# Patient Record
Sex: Female | Born: 1965 | Race: White | Hispanic: No | Marital: Married | Smoking: Former smoker
Health system: Southern US, Community
[De-identification: ages and names within clinical notes are randomized; demographics above are authoritative.]

## PROBLEM LIST (undated history)

## (undated) DIAGNOSIS — B019 Varicella without complication: Secondary | ICD-10-CM

## (undated) DIAGNOSIS — R51 Headache: Secondary | ICD-10-CM

## (undated) DIAGNOSIS — G43909 Migraine, unspecified, not intractable, without status migrainosus: Secondary | ICD-10-CM

## (undated) DIAGNOSIS — T7840XA Allergy, unspecified, initial encounter: Secondary | ICD-10-CM

## (undated) DIAGNOSIS — K259 Gastric ulcer, unspecified as acute or chronic, without hemorrhage or perforation: Secondary | ICD-10-CM

## (undated) DIAGNOSIS — M1990 Unspecified osteoarthritis, unspecified site: Secondary | ICD-10-CM

## (undated) DIAGNOSIS — F32A Depression, unspecified: Secondary | ICD-10-CM

## (undated) DIAGNOSIS — F329 Major depressive disorder, single episode, unspecified: Secondary | ICD-10-CM

## (undated) DIAGNOSIS — K219 Gastro-esophageal reflux disease without esophagitis: Secondary | ICD-10-CM

## (undated) DIAGNOSIS — B977 Papillomavirus as the cause of diseases classified elsewhere: Secondary | ICD-10-CM

## (undated) DIAGNOSIS — R519 Headache, unspecified: Secondary | ICD-10-CM

## (undated) DIAGNOSIS — E785 Hyperlipidemia, unspecified: Secondary | ICD-10-CM

## (undated) DIAGNOSIS — U071 COVID-19: Secondary | ICD-10-CM

## (undated) DIAGNOSIS — N200 Calculus of kidney: Secondary | ICD-10-CM

## (undated) DIAGNOSIS — F419 Anxiety disorder, unspecified: Secondary | ICD-10-CM

## (undated) DIAGNOSIS — R87619 Unspecified abnormal cytological findings in specimens from cervix uteri: Secondary | ICD-10-CM

## (undated) HISTORY — DX: Unspecified osteoarthritis, unspecified site: M19.90

## (undated) HISTORY — DX: Allergy, unspecified, initial encounter: T78.40XA

## (undated) HISTORY — DX: Anxiety disorder, unspecified: F41.9

## (undated) HISTORY — PX: OTHER SURGICAL HISTORY: SHX169

## (undated) HISTORY — DX: Unspecified abnormal cytological findings in specimens from cervix uteri: R87.619

## (undated) HISTORY — DX: Hyperlipidemia, unspecified: E78.5

## (undated) HISTORY — DX: Headache, unspecified: R51.9

## (undated) HISTORY — DX: Varicella without complication: B01.9

## (undated) HISTORY — DX: Gastric ulcer, unspecified as acute or chronic, without hemorrhage or perforation: K25.9

## (undated) HISTORY — DX: Gastro-esophageal reflux disease without esophagitis: K21.9

## (undated) HISTORY — DX: Papillomavirus as the cause of diseases classified elsewhere: B97.7

## (undated) HISTORY — DX: Headache: R51

## (undated) HISTORY — DX: Calculus of kidney: N20.0

## (undated) HISTORY — PX: HERNIA REPAIR: SHX51

## (undated) HISTORY — DX: Major depressive disorder, single episode, unspecified: F32.9

## (undated) HISTORY — PX: TONSILLECTOMY: SHX5217

## (undated) HISTORY — DX: Depression, unspecified: F32.A

## (undated) HISTORY — PX: UPPER GI ENDOSCOPY: SHX6162

## (undated) HISTORY — DX: COVID-19: U07.1

## (undated) HISTORY — DX: Migraine, unspecified, not intractable, without status migrainosus: G43.909

---

## 2001-06-16 ENCOUNTER — Other Ambulatory Visit: Admission: RE | Admit: 2001-06-16 | Discharge: 2001-06-16 | Payer: Self-pay | Admitting: Obstetrics and Gynecology

## 2001-09-24 ENCOUNTER — Ambulatory Visit (HOSPITAL_COMMUNITY): Admission: RE | Admit: 2001-09-24 | Discharge: 2001-09-24 | Payer: Self-pay | Admitting: Obstetrics and Gynecology

## 2001-11-17 ENCOUNTER — Inpatient Hospital Stay (HOSPITAL_COMMUNITY): Admission: AD | Admit: 2001-11-17 | Discharge: 2001-11-17 | Payer: Self-pay | Admitting: Obstetrics and Gynecology

## 2001-11-30 ENCOUNTER — Inpatient Hospital Stay (HOSPITAL_COMMUNITY): Admission: AD | Admit: 2001-11-30 | Discharge: 2001-11-30 | Payer: Self-pay | Admitting: Obstetrics and Gynecology

## 2001-12-21 ENCOUNTER — Inpatient Hospital Stay (HOSPITAL_COMMUNITY): Admission: AD | Admit: 2001-12-21 | Discharge: 2001-12-23 | Payer: Self-pay | Admitting: Obstetrics and Gynecology

## 2002-01-29 ENCOUNTER — Other Ambulatory Visit: Admission: RE | Admit: 2002-01-29 | Discharge: 2002-01-29 | Payer: Self-pay | Admitting: Obstetrics and Gynecology

## 2004-08-08 ENCOUNTER — Ambulatory Visit: Payer: Self-pay | Admitting: Unknown Physician Specialty

## 2004-09-20 ENCOUNTER — Observation Stay: Payer: Self-pay | Admitting: Obstetrics & Gynecology

## 2004-10-13 ENCOUNTER — Inpatient Hospital Stay: Payer: Self-pay

## 2007-04-07 ENCOUNTER — Ambulatory Visit: Payer: Self-pay | Admitting: Unknown Physician Specialty

## 2008-03-22 ENCOUNTER — Ambulatory Visit: Payer: Self-pay | Admitting: Unknown Physician Specialty

## 2008-04-12 ENCOUNTER — Ambulatory Visit: Payer: Self-pay | Admitting: Unknown Physician Specialty

## 2010-03-28 ENCOUNTER — Ambulatory Visit: Payer: Self-pay | Admitting: Unknown Physician Specialty

## 2011-04-30 ENCOUNTER — Ambulatory Visit: Payer: Self-pay | Admitting: Unknown Physician Specialty

## 2011-12-09 ENCOUNTER — Ambulatory Visit: Payer: Self-pay | Admitting: Internal Medicine

## 2011-12-10 ENCOUNTER — Ambulatory Visit: Payer: Self-pay | Admitting: Internal Medicine

## 2015-01-12 ENCOUNTER — Encounter: Payer: Self-pay | Admitting: General Surgery

## 2015-01-12 LAB — HM MAMMOGRAPHY

## 2015-01-12 LAB — HM PAP SMEAR

## 2015-01-26 ENCOUNTER — Ambulatory Visit (INDEPENDENT_AMBULATORY_CARE_PROVIDER_SITE_OTHER): Payer: BC Managed Care – PPO | Admitting: General Surgery

## 2015-01-26 ENCOUNTER — Encounter: Payer: Self-pay | Admitting: General Surgery

## 2015-01-26 VITALS — BP 102/60 | HR 72 | Resp 14 | Ht 64.0 in | Wt 141.0 lb

## 2015-01-26 DIAGNOSIS — K5732 Diverticulitis of large intestine without perforation or abscess without bleeding: Secondary | ICD-10-CM

## 2015-01-26 DIAGNOSIS — R1032 Left lower quadrant pain: Secondary | ICD-10-CM

## 2015-01-26 DIAGNOSIS — Z1211 Encounter for screening for malignant neoplasm of colon: Secondary | ICD-10-CM | POA: Diagnosis not present

## 2015-01-26 MED ORDER — POLYETHYLENE GLYCOL 3350 17 GM/SCOOP PO POWD: 1.0000 | Freq: Once | ORAL | Status: DC

## 2015-01-26 MED ORDER — DOXYCYCLINE HYCLATE 100 MG PO CAPS: 100.0000 mg | ORAL_CAPSULE | Freq: Two times a day (BID) | ORAL | Status: DC

## 2015-01-26 NOTE — Progress Notes (Signed)
Patient ID: Brooke Chambers, female DOB: 05/13/1966, 48 y.o. MRN: [id number]  Chief Complaint   Patient presents with   .  Colonoscopy    HPI  Brooke Chambers is a 49 y.o. female. Here today to discuss having a colonoscopy. She has not had one in the past. She does admit to recent gastrointestinal changes over the past 4 weeks. Bowels move 3 times daily and are loose. Denies waking up at night for bowel movements. Denies any bleeding. She has been trying to eat healthier for about 3 months, avoiding sweets and processed foods. No weight loss.  Denies family history of colon cancer. Denies any appetite changes. She does complain of feeling bloated, all [place] which has been for several years.  Denies reflux issues.  She did have a transvaginal ultrasound that was normal. She does state that 4-5 weeks ago she started having sharp pains left flank./abdomen and thought it was kidney stones.  Her last kidney stone episode was mid 1990's.  HPI  Past Medical History   Diagnosis  Date   .  Kidney stone     Past Surgical History   Procedure  Laterality  Date   .  Tonsillectomy     .  Hernia repair  Left     No family history on file.  Social History  History   Substance Use Topics   .  Smoking status:  Former Smoker -- 7 years     Quit date:  08/26/1992   .  Smokeless tobacco:  Never Used   .  Alcohol Use:  0.0 oz/week     0 Standard drinks or equivalent per week      Comment: occasionally    Allergies   Allergen  Reactions   .  Aleve [Naproxen]  Swelling    Current Outpatient Prescriptions   Medication  Sig  Dispense  Refill   .  doxycycline (VIBRAMYCIN) 100 MG capsule  Take 1 capsule (100 mg total) by mouth 2 (two) times daily.  20 capsule  0   .  polyethylene glycol powder (GLYCOLAX/MIRALAX) powder  Take 255 g by mouth once.  255 g  0    No current facility-administered medications for this visit.    Review of Systems  Review of Systems  Constitutional: Negative.  Respiratory: Negative.    Cardiovascular: Negative.  Gastrointestinal: Positive for diarrhea and abdominal distention. Negative for nausea, vomiting and blood in stool.   Blood pressure 102/60, pulse 72, resp. rate 14, height 5' 4" (1.626 m), weight 141 lb (63.957 kg).  Physical Exam  Physical Exam  Constitutional: She is oriented to person, place, and time. She appears well-developed and well-nourished.  Neck: Neck supple.  Cardiovascular: Normal rate, regular rhythm and normal heart sounds.  Pulmonary/Chest: Effort normal and breath sounds normal.  Abdominal: Soft. Normal appearance and bowel sounds are normal. There is tenderness in the left lower quadrant. Hernia confirmed negative in the right inguinal area and confirmed negative in the left inguinal area.    Moderate tenderness in the left lower quadrant with firm pressure.  Lymphadenopathy:  She has no cervical adenopathy.  Neurological: She is alert and oriented to person, place, and time.  Skin: Skin is warm and dry.   Data Reviewed  The patient reports transvaginal ultrasound completed at [facility] was normal.  GYN notes dated 01/12/2015 from Philip Rosenow, M.D. reviewed.  Assessment   Left lower quadrant pain, possible smoldering diverticulitis.  Change in bowel habits, likely related   to above.   Plan   We'll place her on a ten-Penman course of doxycycline. We'll check a baseline CBC and sedimentation rate. I anticipate both will be normal. The plan for a colonoscopy later this month and at that time assess her improvement on the short course of doxycycline. The importance of avoiding excessive sun exposure was emphasized.   Labs today.  Doxycycline 100 mg for 10 days.  Colonoscopy with possible biopsy/polypectomy prn: Information regarding the procedure, including its potential risks and complications (including but not limited to perforation of the bowel, which may require emergency surgery to repair, and bleeding) was verbally given to the  patient. Educational information regarding lower instestinal endoscopy was given to the patient. Written instructions for how to complete the bowel prep using Miralax were provided. The importance of drinking ample fluids to avoid dehydration as a result of the prep emphasized.  Patient has been scheduled for a colonoscopy at [facility] on 02/14/15. She is aware to pre register with the hospital at least two days prior. Miralax prescription has been sent into her pharmacy. Patient is aware of date and instructions.  PCP: None  Ref: Dr. Rosenow  Dorma Altman W  01/27/2015, 12:30 PM  

## 2015-01-26 NOTE — Patient Instructions (Addendum)
Colonoscopy A colonoscopy is an exam to look at the entire large intestine (colon). This exam can help find problems such as tumors, polyps, inflammation, and areas of bleeding. The exam takes about 1 hour.  LET [facility] CARE PROVIDER KNOW ABOUT:   Any allergies you have.  All medicines you are taking, including vitamins, herbs, eye drops, creams, and over-the-counter medicines.  Previous problems you or members of your family have had with the use of anesthetics.  Any blood disorders you have.  Previous surgeries you have had.  Medical conditions you have. RISKS AND COMPLICATIONS  Generally, this is a safe procedure. However, as with any procedure, complications can occur. Possible complications include:  Bleeding.  Tearing or rupture of the colon wall.  Reaction to medicines given during the exam.  Infection (rare). BEFORE THE PROCEDURE   Ask your health care provider about changing or stopping your regular medicines.  You may be prescribed an oral bowel prep. This involves drinking a large amount of medicated liquid, starting the Micheli before your procedure. The liquid will cause you to have multiple loose stools until your stool is almost clear or light green. This cleans out your colon in preparation for the procedure.  Do not eat or drink anything else once you have started the bowel prep, unless your health care provider tells you it is safe to do so.  Arrange for someone to drive you home after the procedure. PROCEDURE   You will be given medicine to help you relax (sedative).  You will lie on your side with your knees bent.  A long, flexible tube with a light and camera on the end (colonoscope) will be inserted through the rectum and into the colon. The camera sends video back to a computer screen as it moves through the colon. The colonoscope also releases carbon dioxide gas to inflate the colon. This helps your health care provider see the area better.  During  the exam, your health care provider may take a small tissue sample (biopsy) to be examined under a microscope if any abnormalities are found.  The exam is finished when the entire colon has been viewed. AFTER THE PROCEDURE   Do not drive for 24 hours after the exam.  You may have a small amount of blood in your stool.  You may pass moderate amounts of gas and have mild abdominal cramping or bloating. This is caused by the gas used to inflate your colon during the exam.  Ask when your test results will be ready and how you will get your results. Make sure you get your test results. Document Released: 08/09/2000 Document Revised: 06/02/2013 Document Reviewed: 04/19/2013 [facility] Patient Information 2015 [place], [place]. This information is not intended to replace advice given to you by your health care provider. Make sure you discuss any questions you have with your health care provider. Diverticulitis Diverticulitis is inflammation or infection of small pouches in your colon that form when you have a condition called diverticulosis. The pouches in your colon are called diverticula. Your colon, or large intestine, is where water is absorbed and stool is formed. Complications of diverticulitis can include:  Bleeding.  Severe infection.  Severe pain.  Perforation of your colon.  Obstruction of your colon. CAUSES  Diverticulitis is caused by bacteria. Diverticulitis happens when stool becomes trapped in diverticula. This allows bacteria to grow in the diverticula, which can lead to inflammation and infection. RISK FACTORS People with diverticulosis are at risk for diverticulitis. Eating  a diet that does not include enough fiber from fruits and vegetables may make diverticulitis more likely to develop. SYMPTOMS  Symptoms of diverticulitis may include:  Abdominal pain and tenderness. The pain is normally located on the left side of the abdomen, but may occur in other areas.  Fever and  chills.  Bloating.  Cramping.  Nausea.  Vomiting.  Constipation.  Diarrhea.  Blood in your stool. DIAGNOSIS  Your health care provider will ask you about your medical history and do a physical exam. You may need to have tests done because many medical conditions can cause the same symptoms as diverticulitis. Tests may include:  Blood tests.  Urine tests.  Imaging tests of the abdomen, including X-rays and CT scans. When your condition is under control, your health care provider may recommend that you have a colonoscopy. A colonoscopy can show how severe your diverticula are and whether something else is causing your symptoms. TREATMENT  Most cases of diverticulitis are mild and can be treated at home. Treatment may include:  Taking over-the-counter pain medicines.  Following a clear liquid diet.  Taking antibiotic medicines by mouth for 7-10 days. More severe cases may be treated at a hospital. Treatment may include:  Not eating or drinking.  Taking prescription pain medicine.  Receiving antibiotic medicines through an IV tube.  Receiving fluids and nutrition through an IV tube.  Surgery. HOME CARE INSTRUCTIONS   Follow your health care provider's instructions carefully.  Follow a full liquid diet or other diet as directed by your health care provider. After your symptoms improve, your health care provider may tell you to change your diet. He or she may recommend you eat a high-fiber diet. Fruits and vegetables are good sources of fiber. Fiber makes it easier to pass stool.  Take fiber supplements or probiotics as directed by your health care provider.  Only take medicines as directed by your health care provider.  Keep all your follow-up appointments. SEEK MEDICAL CARE IF:   Your pain does not improve.  You have a hard time eating food.  Your bowel movements do not return to normal. SEEK IMMEDIATE MEDICAL CARE IF:   Your pain becomes worse.  Your  symptoms do not get better.  Your symptoms suddenly get worse.  You have a fever.  You have repeated vomiting.  You have bloody or black, tarry stools. MAKE SURE YOU:   Understand these instructions.  Will watch your condition.  Will get help right away if you are not doing well or get worse. Document Released: 05/22/2005 Document Revised: 08/17/2013 Document Reviewed: 07/07/2013 [facility] Patient Information 2015 [place]. This information is not intended to replace advice given to you by your health care provider. Make sure you discuss any questions you have with your health care provider.  [facility]. She is aware to pre register with the hospital at least two days prior. Miralax prescription has been sent into her pharmacy. Patient is aware of date and instructions.

## 2015-01-27 DIAGNOSIS — R1032 Left lower quadrant pain: Secondary | ICD-10-CM | POA: Insufficient documentation

## 2015-01-27 DIAGNOSIS — Z1211 Encounter for screening for malignant neoplasm of colon: Secondary | ICD-10-CM | POA: Insufficient documentation

## 2015-01-27 DIAGNOSIS — K5732 Diverticulitis of large intestine without perforation or abscess without bleeding: Secondary | ICD-10-CM | POA: Insufficient documentation

## 2015-01-27 LAB — CBC WITH DIFFERENTIAL/PLATELET
BASOS ABS: 0 10*3/uL (ref 0.0–0.2)
BASOS: 1 %
EOS (ABSOLUTE): 0.2 10*3/uL (ref 0.0–0.4)
Eos: 3 %
HEMOGLOBIN: 12 g/dL (ref 11.1–15.9)
Hematocrit: 35.6 % (ref 34.0–46.6)
IMMATURE GRANS (ABS): 0 10*3/uL (ref 0.0–0.1)
Immature Granulocytes: 0 %
LYMPHS: 27 %
Lymphocytes Absolute: 1.7 10*3/uL (ref 0.7–3.1)
MCH: 28.4 pg (ref 26.6–33.0)
MCHC: 33.7 g/dL (ref 31.5–35.7)
MCV: 84 fL (ref 79–97)
Monocytes Absolute: 0.5 10*3/uL (ref 0.1–0.9)
Monocytes: 8 %
Neutrophils Absolute: 3.9 10*3/uL (ref 1.4–7.0)
Neutrophils: 61 %
Platelets: 239 10*3/uL (ref 150–379)
RBC: 4.22 x10E6/uL (ref 3.77–5.28)
RDW: 12.8 % (ref 12.3–15.4)
WBC: 6.2 10*3/uL (ref 3.4–10.8)

## 2015-01-27 LAB — SEDIMENTATION RATE: Sed Rate: 2 mm/hr (ref 0–40)

## 2015-01-27 NOTE — H&P (Signed)
Patient ID: Brooke FesterLisa L Wadle, female DOB: 08/08/1966, 49 y.o. MRN: [id number]  Chief Complaint   Patient presents with   .  Colonoscopy    HPI  Brooke Chambers is a 49 y.o. female. Here today to discuss having a colonoscopy. She has not had one in the past. She does admit to recent gastrointestinal changes over the past 4 weeks. Bowels move 3 times daily and are loose. Denies waking up at night for bowel movements. Denies any bleeding. She has been trying to eat healthier for about 3 months, avoiding sweets and processed foods. No weight loss.  Denies family history of colon cancer. Denies any appetite changes. She does complain of feeling bloated, all [place] which has been for several years.  Denies reflux issues.  She did have a transvaginal ultrasound that was normal. She does state that 4-5 weeks ago she started having sharp pains left flank./abdomen and thought it was kidney stones.  Her last kidney stone episode was mid 1990's.  HPI  Past Medical History   Diagnosis  Date   .  Kidney stone     Past Surgical History   Procedure  Laterality  Date   .  Tonsillectomy     .  Hernia repair  Left     No family history on file.  Social History  History   Substance Use Topics   .  Smoking status:  Former Smoker -- 7 years     Quit date:  08/26/1992   .  Smokeless tobacco:  Never Used   .  Alcohol Use:  0.0 oz/week     0 Standard drinks or equivalent per week      Comment: occasionally    Allergies   Allergen  Reactions   .  Aleve [Naproxen]  Swelling    Current Outpatient Prescriptions   Medication  Sig  Dispense  Refill   .  doxycycline (VIBRAMYCIN) 100 MG capsule  Take 1 capsule (100 mg total) by mouth 2 (two) times daily.  20 capsule  0   .  polyethylene glycol powder (GLYCOLAX/MIRALAX) powder  Take 255 g by mouth once.  255 g  0    No current facility-administered medications for this visit.    Review of Systems  Review of Systems  Constitutional: Negative.  Respiratory: Negative.    Cardiovascular: Negative.  Gastrointestinal: Positive for diarrhea and abdominal distention. Negative for nausea, vomiting and blood in stool.   Blood pressure 102/60, pulse 72, resp. rate 14, height 5\' 4"  (1.626 m), weight 141 lb (63.957 kg).  Physical Exam  Physical Exam  Constitutional: She is oriented to person, place, and time. She appears well-developed and well-nourished.  Neck: Neck supple.  Cardiovascular: Normal rate, regular rhythm and normal heart sounds.  Pulmonary/Chest: Effort normal and breath sounds normal.  Abdominal: Soft. Normal appearance and bowel sounds are normal. There is tenderness in the left lower quadrant. Hernia confirmed negative in the right inguinal area and confirmed negative in the left inguinal area.    Moderate tenderness in the left lower quadrant with firm pressure.  Lymphadenopathy:  She has no cervical adenopathy.  Neurological: She is alert and oriented to person, place, and time.  Skin: Skin is warm and dry.   Data Reviewed  The patient [facility].  GYN notes dated 01/12/2015 from Kate SablePhilip Rosenow, M.D. reviewed.  Assessment   Left lower quadrant pain, possible smoldering diverticulitis.  Change in bowel habits, likely related  to above.   Plan   We'll place her on a ten-Kowalczyk course of doxycycline. We'll check a baseline CBC and sedimentation rate. I anticipate both will be normal. The plan for a colonoscopy later this month and at that time assess her improvement on the short course of doxycycline. The importance of avoiding excessive sun exposure was emphasized.   Labs today.  Doxycycline 100 mg for 10 days.  Colonoscopy with possible biopsy/polypectomy prn: Information regarding the procedure, including its potential risks and complications (including but not limited to perforation of the bowel, which may require emergency surgery to repair, and bleeding) was verbally given to the  patient. Educational information regarding lower instestinal endoscopy was given to the patient. Written instructions for how to complete the bowel prep using Miralax were provided. The importance of drinking ample fluids to avoid dehydration as a result of the prep emphasized.  Patient has been scheduled for a colonoscopy at [facility] on 02/14/15. She is aware to pre register with the hospital at least two days prior. Miralax prescription has been sent into her pharmacy. Patient is aware of date and instructions.  PCP: None  Ref: Dr. Janann Colonel, Merrily Pew  01/27/2015, 12:30 PM

## 2015-02-14 ENCOUNTER — Ambulatory Visit: Payer: BC Managed Care – PPO | Admitting: Anesthesiology

## 2015-02-14 ENCOUNTER — Ambulatory Visit
Admission: RE | Admit: 2015-02-14 | Discharge: 2015-02-14 | Disposition: A | Discharge status: Home / Self Care | Payer: BC Managed Care – PPO | Source: Ambulatory Visit | Attending: General Surgery | Admitting: General Surgery

## 2015-02-14 ENCOUNTER — Encounter
Admission: RE | Disposition: A | Discharge status: Home / Self Care | Payer: Self-pay | Source: Ambulatory Visit | Attending: General Surgery

## 2015-02-14 DIAGNOSIS — Z87442 Personal history of urinary calculi: Secondary | ICD-10-CM | POA: Diagnosis not present

## 2015-02-14 DIAGNOSIS — Z9889 Other specified postprocedural states: Secondary | ICD-10-CM | POA: Diagnosis not present

## 2015-02-14 DIAGNOSIS — Z87891 Personal history of nicotine dependence: Secondary | ICD-10-CM | POA: Diagnosis not present

## 2015-02-14 DIAGNOSIS — Z1211 Encounter for screening for malignant neoplasm of colon: Secondary | ICD-10-CM | POA: Diagnosis not present

## 2015-02-14 DIAGNOSIS — Z886 Allergy status to analgesic agent status: Secondary | ICD-10-CM | POA: Diagnosis not present

## 2015-02-14 HISTORY — PX: COLONOSCOPY WITH PROPOFOL: SHX5780

## 2015-02-14 SURGERY — COLONOSCOPY WITH PROPOFOL
Anesthesia: General

## 2015-02-14 MED ORDER — MIDAZOLAM HCL 5 MG/5ML IJ SOLN
INTRAMUSCULAR | Status: DC | PRN
  Administered 2015-02-14: 2 mg via INTRAVENOUS

## 2015-02-14 MED ORDER — SODIUM CHLORIDE 0.9 % IV SOLN
INTRAVENOUS | Status: DC
Start: 2015-02-14 — End: 2015-02-14

## 2015-02-14 MED ORDER — PROPOFOL 10 MG/ML IV BOLUS
INTRAVENOUS | Status: DC | PRN
  Administered 2015-02-14: 50 mg via INTRAVENOUS
  Administered 2015-02-14: 40 mg via INTRAVENOUS

## 2015-02-14 MED ORDER — LIDOCAINE HCL (CARDIAC) 20 MG/ML IV SOLN
INTRAVENOUS | Status: DC | PRN
  Administered 2015-02-14: 30 mg via INTRAVENOUS

## 2015-02-14 MED ORDER — PROPOFOL INFUSION 10 MG/ML OPTIME
INTRAVENOUS | Status: DC | PRN
  Administered 2015-02-14: 120 ug/kg/min via INTRAVENOUS

## 2015-02-14 MED ORDER — SODIUM CHLORIDE 0.45 % IV SOLN
INTRAVENOUS | Status: DC
Start: 2015-02-14 — End: 2015-02-14
  Administered 2015-02-14: 1000 mL via INTRAVENOUS

## 2015-02-14 MED ORDER — FENTANYL CITRATE (PF) 100 MCG/2ML IJ SOLN
INTRAMUSCULAR | Status: DC | PRN
  Administered 2015-02-14: 50 ug via INTRAVENOUS

## 2015-02-14 NOTE — Transfer of Care (Signed)
Immediate Anesthesia Transfer of Care Note  Patient: Brooke Chambers  Procedure(s) Performed: Procedure(s): COLONOSCOPY WITH PROPOFOL (N/A)  Patient Location: PACU  Anesthesia Type:General  Level of Consciousness: awake and patient cooperative  Airway & Oxygen Therapy: Patient Spontanous Breathing and Patient connected to nasal cannula oxygen  Post-op Assessment: Report given to RN and Post -op Vital signs reviewed and stable  Post vital signs: Reviewed and stable  Last Vitals:  Filed Vitals:   02/14/15 1417  BP: 115/65  Pulse: 65  Temp: 36.8 C  Resp: 16    Complications: No apparent anesthesia complications

## 2015-02-14 NOTE — Anesthesia Postprocedure Evaluation (Signed)
  Anesthesia Post-op Note  Patient: Brooke Chambers  Procedure(s) Performed: Procedure(s): COLONOSCOPY WITH PROPOFOL (N/A)  Anesthesia type:General  Patient location: PACU  Post pain: Pain level controlled  Post assessment: Post-op Vital signs reviewed, Patient's Cardiovascular Status Stable, Respiratory Function Stable, Patent Airway and No signs of Nausea or vomiting  Post vital signs: Reviewed and stable  Last Vitals:  Filed Vitals:   02/14/15 1620  BP: 101/65  Pulse: 63  Temp:   Resp: 11    Level of consciousness: awake, alert  and patient cooperative  Complications: No apparent anesthesia complications

## 2015-02-14 NOTE — Anesthesia Preprocedure Evaluation (Signed)
Anesthesia Evaluation  Patient identified by MRN, date of birth, ID band Patient awake    Reviewed: Allergy & Precautions, NPO status , Patient's Chart, lab work & pertinent test results  History of Anesthesia Complications Negative for: history of anesthetic complications  Airway Mallampati: II  TM Distance: >3 FB Neck ROM: Full    Dental no notable dental hx.    Pulmonary neg pulmonary ROS, former smoker,  breath sounds clear to auscultation  Pulmonary exam normal       Cardiovascular Exercise Tolerance: Good negative cardio ROS Normal cardiovascular examRhythm:Regular Rate:Normal     Neuro/Psych negative neurological ROS  negative psych ROS   GI/Hepatic negative GI ROS, Neg liver ROS,   Endo/Other  negative endocrine ROS  Renal/GU negative Renal ROS  negative genitourinary   Musculoskeletal negative musculoskeletal ROS (+)   Abdominal   Peds negative pediatric ROS (+)  Hematology negative hematology ROS (+)   Anesthesia Other Findings   Reproductive/Obstetrics negative OB ROS                             Anesthesia Physical Anesthesia Plan  ASA: II  Anesthesia Plan: General   Post-op Pain Management:    Induction: Intravenous  Airway Management Planned: Nasal Cannula  Additional Equipment:   Intra-op Plan:   Post-operative Plan:   Informed Consent: I have reviewed the patients History and Physical, chart, labs and discussed the procedure including the risks, benefits and alternatives for the proposed anesthesia with the patient or authorized representative who has indicated his/her understanding and acceptance.     Plan Discussed with: CRNA and Surgeon  Anesthesia Plan Comments:         Anesthesia Quick Evaluation

## 2015-02-14 NOTE — Discharge Instructions (Signed)
No driving today. °Resume your regular diet. ° °

## 2015-02-14 NOTE — H&P (Signed)
LLQ pain resolved w/ doxycycline rx. Well tolerated. Did well with prep. Lungs: Clear. Cardio: RR. ABD: Soft. No residual LLQ tenderness. HEENT: Neg. For colonoscopy.

## 2015-02-14 NOTE — Op Note (Signed)
[facility] Patient Name: Brooke Chambers Procedure Date: 02/14/2015 3:16 PM MRN: [id number] Account #: [id number] Date of Birth: [birth year] Admit Type: Outpatient Age: 49 Room: [facility] ENDO ROOM 1 Gender: Female Note Status: Finalized Procedure:         Colonoscopy Indications:       Screening for colorectal malignant neoplasm Providers:         Earline Mayotte, MD Referring MD:      No Local Md, MD (Referring MD) Medicines:         Monitored Anesthesia Care Complications:     No immediate complications. Procedure:         Pre-Anesthesia Assessment:                    - Prior to the procedure, a History and Physical was                     performed, and patient medications, allergies and                     sensitivities were reviewed. The patient's tolerance of                     previous anesthesia was reviewed.                    - The risks and benefits of the procedure and the sedation                     options and risks were discussed with the patient. All                     questions were answered and informed consent was obtained.                    After obtaining informed consent, the colonoscope was                     passed under direct vision. Throughout the procedure, the                     patient's blood pressure, pulse, and oxygen saturations                     were monitored continuously. The Olympus CF-Q160AL                     colonoscope (S#. [id number]) was introduced through the anus                     and advanced to the the cecum, identified by appendiceal                     orifice and ileocecal valve. The colonoscopy was performed                     without difficulty. The patient tolerated the procedure                     well. The quality of the bowel preparation was excellent. Findings:      The entire examined colon appeared normal on direct and retroflexion       views. Impression:        - The entire examined  colon is normal on direct and  retroflexion views.                    - No specimens collected. Recommendation:    - Repeat colonoscopy in 10 years for screening purposes. Diagnosis Code(s): --- Professional ---                    Z12.11, Encounter for screening for malignant neoplasm of                     colon Earline Mayotte, MD 02/14/2015 3:41:50 PM This report has been signed electronically. Number of Addenda: 0 Note Initiated On: 02/14/2015 3:16 PM Scope Withdrawal Time: 0 hours 6 minutes 44 seconds  Total Procedure Duration: 0 hours 14 minutes 18 seconds       [facility]

## 2015-02-15 ENCOUNTER — Encounter: Payer: Self-pay | Admitting: General Surgery

## 2015-02-15 LAB — POCT PREGNANCY, URINE: PREG TEST UR: NEGATIVE

## 2015-03-01 NOTE — Addendum Note (Signed)
Addendum  created 03/01/15 1605 by Forestine Chutearrie Rachal Dvorsky, MD   Modules edited: Anesthesia Responsible Staff

## 2017-11-27 ENCOUNTER — Encounter: Payer: Self-pay | Admitting: Internal Medicine

## 2017-11-27 ENCOUNTER — Ambulatory Visit: Payer: BC Managed Care – PPO | Admitting: Internal Medicine

## 2017-11-27 VITALS — BP 112/60 | HR 70 | Temp 98.9°F | Ht 64.0 in | Wt 142.6 lb

## 2017-11-27 DIAGNOSIS — M542 Cervicalgia: Secondary | ICD-10-CM | POA: Diagnosis not present

## 2017-11-27 DIAGNOSIS — R079 Chest pain, unspecified: Secondary | ICD-10-CM

## 2017-11-27 DIAGNOSIS — R0602 Shortness of breath: Secondary | ICD-10-CM | POA: Diagnosis not present

## 2017-11-27 DIAGNOSIS — Z1231 Encounter for screening mammogram for malignant neoplasm of breast: Secondary | ICD-10-CM

## 2017-11-27 DIAGNOSIS — L570 Actinic keratosis: Secondary | ICD-10-CM | POA: Diagnosis not present

## 2017-11-27 DIAGNOSIS — F419 Anxiety disorder, unspecified: Secondary | ICD-10-CM

## 2017-11-27 DIAGNOSIS — J302 Other seasonal allergic rhinitis: Secondary | ICD-10-CM | POA: Diagnosis not present

## 2017-11-27 DIAGNOSIS — K219 Gastro-esophageal reflux disease without esophagitis: Secondary | ICD-10-CM

## 2017-11-27 DIAGNOSIS — F329 Major depressive disorder, single episode, unspecified: Secondary | ICD-10-CM | POA: Diagnosis not present

## 2017-11-27 NOTE — Progress Notes (Signed)
Pre visit review using our clinic review tool, if applicable. No additional management support is needed unless otherwise documented below in the visit note. 

## 2017-11-27 NOTE — Patient Instructions (Addendum)
Cerave/cetaphil lotion or cream. Cream more moisturizing  Pap at f/u  Labs fasting [facility] fasting x 12 hours  Pap mid to late May  Please schedule mammogram   Generalized Anxiety Disorder, Adult Generalized anxiety disorder (GAD) is a mental health disorder. People with this condition constantly worry about everyday events. Unlike normal anxiety, worry related to GAD is not triggered by a specific event. These worries also do not fade or get better with time. GAD interferes with life functions, including relationships, work, and school. GAD can vary from mild to severe. People with severe GAD can have intense waves of anxiety with physical symptoms (panic attacks). What are the causes? The exact cause of GAD is not known. What increases the risk? This condition is more likely to develop in:  Women.  People who have a family history of anxiety disorders.  People who are very shy.  People who experience very stressful life events, such as the death of a loved one.  People who have a very stressful family environment.  What are the signs or symptoms? People with GAD often worry excessively about many things in their lives, such as their health and family. They may also be overly concerned about:  Doing well at work.  Being on time.  Natural disasters.  Friendships.  Physical symptoms of GAD include:  Fatigue.  Muscle tension or having muscle twitches.  Trembling or feeling shaky.  Being easily startled.  Feeling like your heart is pounding or racing.  Feeling out of breath or like you cannot take a deep breath.  Having trouble falling asleep or staying asleep.  Sweating.  Nausea, diarrhea, or irritable bowel syndrome (IBS).  Headaches.  Trouble concentrating or remembering facts.  Restlessness.  Irritability.  How is this diagnosed? Your health care provider can diagnose GAD based on your symptoms and medical history. You will also have a physical exam.  The health care provider will ask specific questions about your symptoms, including how severe they are, when they started, and if they come and go. Your health care provider may ask you about your use of alcohol or drugs, including prescription medicines. Your health care provider may refer you to a mental health specialist for further evaluation. Your health care provider will do a thorough examination and may perform additional tests to rule out other possible causes of your symptoms. To be diagnosed with GAD, a person must have anxiety that:  Is out of his or her control.  Affects several different aspects of his or her life, such as work and relationships.  Causes distress that makes him or her unable to take part in normal activities.  Includes at least three physical symptoms of GAD, such as restlessness, fatigue, trouble concentrating, irritability, muscle tension, or sleep problems.  Before your health care provider can confirm a diagnosis of GAD, these symptoms must be present more days than they are not, and they must last for six months or longer. How is this treated? The following therapies are usually used to treat GAD:  Medicine. Antidepressant medicine is usually prescribed for long-term daily control. Antianxiety medicines may be added in severe cases, especially when panic attacks occur.  Talk therapy (psychotherapy). Certain types of talk therapy can be helpful in treating GAD by providing support, education, and guidance. Options include: ? Cognitive behavioral therapy (CBT). People learn coping skills and techniques to ease their anxiety. They learn to identify unrealistic or negative thoughts and behaviors and to replace them with positive ones. ?  Acceptance and commitment therapy (ACT). This treatment teaches people how to be mindful as a way to cope with unwanted thoughts and feelings. ? Biofeedback. This process trains you to manage your body's response (physiological  response) through breathing techniques and relaxation methods. You will work with a therapist while machines are used to monitor your physical symptoms.  Stress management techniques. These include yoga, meditation, and exercise.  A mental health specialist can help determine which treatment is best for you. Some people see improvement with one type of therapy. However, other people require a combination of therapies. Follow these instructions at home:  Take over-the-counter and prescription medicines only as told by your health care provider.  Try to maintain a normal routine.  Try to anticipate stressful situations and allow extra time to manage them.  Practice any stress management or self-calming techniques as taught by your health care provider.  Do not punish yourself for setbacks or for not making progress.  Try to recognize your accomplishments, even if they are small.  Keep all follow-up visits as told by your health care provider. This is important. Contact a health care provider if:  Your symptoms do not get better.  Your symptoms get worse.  You have signs of depression, such as: ? A persistently sad, cranky, or irritable mood. ? Loss of enjoyment in activities that used to bring you joy. ? Change in weight or eating. ? Changes in sleeping habits. ? Avoiding friends or family members. ? Loss of energy for normal tasks. ? Feelings of guilt or worthlessness. Get help right away if:  You have serious thoughts about hurting yourself or others. If you ever feel like you may hurt yourself or others, or have thoughts about taking your own life, get help right away. You can go to your nearest emergency department or call:  Your local emergency services (911 in the [place]  A suicide crisis helpline, such as the National Suicide Prevention Lifeline at 352-629-8036. This is open 24 hours a [place].  Summary  Generalized anxiety disorder (GAD) is a mental health disorder  that involves worry that is not triggered by a specific event.  People with GAD often worry excessively about many things in their lives, such as their health and family.  GAD may cause physical symptoms such as restlessness, trouble concentrating, sleep problems, frequent sweating, nausea, diarrhea, headaches, and trembling or muscle twitching.  A mental health specialist can help determine which treatment is best for you. Some people see improvement with one type of therapy. However, other people require a combination of therapies. This information is not intended to replace advice given to you by your health care provider. Make sure you discuss any questions you have with your health care provider. Document Released: 12/07/2012 Document Revised: 07/02/2016 Document Reviewed: 07/02/2016 Elsevier Interactive Patient Education  2018 [employer]   Nonspecific Chest Pain Chest pain can be caused by many different conditions. There is always a chance that your pain could be related to something serious, such as a heart attack or a blood clot in your lungs. Chest pain can also be caused by conditions that are not life-threatening. If you have chest pain, it is very important to follow up with your health care provider. What are the causes? Causes of this condition include:  Heartburn.  Pneumonia or bronchitis.  Anxiety or stress.  Inflammation around your heart (pericarditis) or lung (pleuritis or pleurisy).  A blood clot in your lung.  A collapsed lung (pneumothorax). This  can develop suddenly on its own (spontaneous pneumothorax) or from trauma to the chest.  Shingles infection (varicella-zoster virus).  Heart attack.  Damage to the bones, muscles, and cartilage that make up your chest wall. This can include: ? Bruised bones due to injury. ? Strained muscles or cartilage due to frequent or repeated coughing or overwork. ? Fracture to one or more ribs. ? Sore cartilage due to  inflammation (costochondritis).  What increases the risk? Risk factors for this condition may include:  Activities that increase your risk for trauma or injury to your chest.  Respiratory infections or conditions that cause frequent coughing.  Medical conditions or overeating that can cause heartburn.  Heart disease or family history of heart disease.  Conditions or health behaviors that increase your risk of developing a blood clot.  Having had chicken pox (varicella zoster).  What are the signs or symptoms? Chest pain can feel like:  Burning or tingling on the surface of your chest or deep in your chest.  Crushing, pressure, aching, or squeezing pain.  Dull or sharp pain that is worse when you move, cough, or take a deep breath.  Pain that is also felt in your back, neck, shoulder, or arm, or pain that spreads to any of these areas.  Your chest pain may come and go, or it may stay constant. How is this diagnosed? Lab tests or other studies may be needed to find the cause of your pain. Your health care provider may have you take a test called an ECG (electrocardiogram). An ECG records your heartbeat patterns at the time the test is performed. You may also have other tests, such as:  Transthoracic echocardiogram (TTE). In this test, sound waves are used to create a picture of the heart structures and to look at how blood flows through your heart.  Transesophageal echocardiogram (TEE).This is a more advanced imaging test that takes images from inside your body. It allows your health care provider to see your heart in finer detail.  Cardiac monitoring. This allows your health care provider to monitor your heart rate and rhythm in real time.  Holter monitor. This is a portable device that records your heartbeat and can help to diagnose abnormal heartbeats. It allows your health care provider to track your heart activity for several days, if needed.  Stress tests. These can be  done through exercise or by taking medicine that makes your heart beat more quickly.  Blood tests.  Other imaging tests.  How is this treated? Treatment depends on what is causing your chest pain. Treatment may include:  Medicines. These may include: ? Acid blockers for heartburn. ? Anti-inflammatory medicine. ? Pain medicine for inflammatory conditions. ? Antibiotic medicine, if an infection is present. ? Medicines to dissolve blood clots. ? Medicines to treat coronary artery disease (CAD).  Supportive care for conditions that do not require medicines. This may include: ? Resting. ? Applying heat or cold packs to injured areas. ? Limiting activities until pain decreases.  Follow these instructions at home: Medicines  If you were prescribed an antibiotic, take it as told by your health care provider. Do not stop taking the antibiotic even if you start to feel better.  Take over-the-counter and prescription medicines only as told by your health care provider. Lifestyle  Do not use any products that contain nicotine or tobacco, such as cigarettes and e-cigarettes. If you need help quitting, ask your health care provider.  Do not drink alcohol.  Make lifestyle  changes as directed by your health care provider. These may include: ? Getting regular exercise. Ask your health care provider to suggest some activities that are safe for you. ? Eating a heart-healthy diet. A registered dietitian can help you to learn healthy eating options. ? Maintaining a healthy weight. ? Managing diabetes, if necessary. ? Reducing stress, such as with yoga or relaxation techniques. General instructions  Avoid any activities that bring on chest pain.  If heartburn is the cause for your chest pain, raise (elevate) the head of your bed about 6 inches (15 cm) by putting blocks under the legs. Sleeping with more pillows does not effectively relieve heartburn because it only changes the position of your  head.  Keep all follow-up visits as told by your health care provider. This is important. This includes any further testing if your chest pain does not go away. Contact a health care provider if:  Your chest pain does not go away.  You have a rash with blisters on your chest.  You have a fever.  You have chills. Get help right away if:  Your chest pain is worse.  You have a cough that gets worse, or you cough up blood.  You have severe pain in your abdomen.  You have severe weakness.  You faint.  You have sudden, unexplained chest discomfort.  You have sudden, unexplained discomfort in your arms, back, neck, or jaw.  You have shortness of breath at any time.  You suddenly start to sweat, or your skin gets clammy.  You feel nauseous or you vomit.  You suddenly feel light-headed or dizzy.  Your heart begins to beat quickly, or it feels like it is skipping beats. These symptoms may represent a serious problem that is an emergency. Do not wait to see if the symptoms will go away. Get medical help right away. Call your local emergency services (911 in the [place] Do not drive yourself to the hospital. This information is not intended to replace advice given to you by your health care provider. Make sure you discuss any questions you have with your health care provider. Document Released: 05/22/2005 Document Revised: 05/06/2016 Document Reviewed: 05/06/2016 Elsevier Interactive Patient Education  2017 [employer]   Actinic Keratosis An actinic keratosis is a precancerous growth on the skin. This means that it could develop into skin cancer if it is not treated. About 1% of these growths (actinic keratoses) turn into skin cancer within one year if they are not treated. It is important to have all of these growths evaluated to determine the best treatment approach. What are the causes? This condition is caused by getting too much ultraviolet (UV) radiation from the sun or  other UV light sources. What increases the risk? The following factors may make you more likely to develop this condition:  Having light-colored skin and blue eyes.  Having blonde or red hair.  Spending a lot of time in the sun.  Inadequate skin protection when outdoors. This may include: ? Not using sunscreen properly. ? Not covering up skin that is exposed to sunlight.  Aging. The risk of developing an actinic keratosis increases with age.  What are the signs or symptoms? Actinic keratoses look like scaly, rough spots of skin.They can be as small as a pinhead or as big as a quarter. They may itch, hurt, or feel sensitive. In most cases, the growths become red. In some cases, they may be skin-colored, light tan, dark tan, pink, or a  combination of any of these colors. There may be a small piece of pink or gray skin (skin tag) growing from the actinic keratosis. In some cases, it may be easier to notice actinic keratoses by feeling them, rather than seeing them. Actinic keratoses appear most often on areas of skin that get a lot of sun exposure, including the scalp, face, ears, lips, upper back, forearms, and the backs of the hands. Sometimes, actinic keratoses disappear, but many reappear a few days to a few weeks later. How is this diagnosed? This condition is usually diagnosed with a physical exam. A tissue sample may be removed from the actinic keratosis and examined under a microscope (biopsy). How is this treated?  Treatment for this condition may include:  Scraping off the actinic keratosis (curettage).  Freezing the actinic keratosis with liquid nitrogen (cryosurgery). This causes the growth to eventually fall off the skin.  Applying medicated creams or gels to destroy the cells in the growth.  Applying chemicals to the actinic keratosis to make the outer layers of skin peel off (chemical peel).  Photodynamic therapy. In this procedure, medicated cream is applied to the  actinic keratosis. This cream increases your skin's sensitivity to light. Then, a strong light is aimed at the actinic keratosis to destroy cells in the growth.  Follow these instructions at home: Skin care  Apply cool, wet cloths (cool compresses) to the affected areas.  Do not scratch your skin.  Check your skin regularly for any growths, especially growths that: ? Start to itch or bleed. ? Change in size, shape, or color. Caring for the treated area  Keep the treated area clean and dry as told by your health care provider.  Do not apply any medicine, cream, or lotion to the treated area unless your health care provider tells you to do that.  Do not pick at blisters or try to break them open. This can cause infection and scarring.  If you have red or irritated skin after treatment, follow instructions from your health care provider about how to take care of the treated area. Make sure you: ? Wash your hands with soap and water before you change your bandage (dressing). If soap and water are not available, use hand sanitizer. ? Change your dressing as told by your health care provider.  If you have red or irritated skin after treatment, check your treated area every [place] for signs of infection. Check for: ? Swelling, pain, or more redness. ? Fluid or blood. ? Warmth. ? Pus or a bad smell. General instructions  Take over-the-counter and prescription medicines only as told by your health care provider.  Return to your normal activities as told by your health care provider. Ask your health care provider what activities are safe for you.  Do not use any tobacco products, such as cigarettes, chewing tobacco, and e-cigarettes. If you need help quitting, ask your health care provider.  Have a skin exam done every year by a health care provider who is a skin conditions specialist (dermatologist).  Keep all follow-up visits as told by your health care provider. This is important. How  is this prevented?  Do not get sunburns.  Try to avoid the sun between 10:00 a.m. and 4:00 p.m. This is when the UV light is the strongest.  Use a sunscreen or sunblock with SPF 30 (sun protection factor 30) or greater.  Apply sunscreen before you are exposed to sunlight, and reapply periodically as often as directed by  the instructions on the sunscreen container.  Always wear sunglasses that have UV protection, and always wear hats and clothing to protect your skin from sunlight.  When possible, avoid medicines that increase your sensitivity to sunlight. These include: ? Certain antibiotic medicines. ? Certain water pills (diuretics). ? Certain prescription medicines that are used to treat acne (retinoids).  Do not use tanning beds or other indoor tanning devices. Contact a health care provider if:  You notice any changes or new growths on your skin.  You have swelling, pain, or more redness around your treated area.  You have fluid or blood coming from your treated area.  Your treated area feels warm to the touch.  You have pus or a bad smell coming from your treated area.  You have a fever.  You have a blister that becomes large and painful. This information is not intended to replace advice given to you by your health care provider. Make sure you discuss any questions you have with your health care provider. Document Released: 11/08/2008 Document Revised: 04/12/2016 Document Reviewed: 04/22/2015 Elsevier Interactive Patient Education  [employer].

## 2017-11-30 ENCOUNTER — Encounter: Payer: Self-pay | Admitting: Internal Medicine

## 2017-11-30 DIAGNOSIS — K219 Gastro-esophageal reflux disease without esophagitis: Secondary | ICD-10-CM | POA: Insufficient documentation

## 2017-11-30 DIAGNOSIS — M542 Cervicalgia: Secondary | ICD-10-CM | POA: Insufficient documentation

## 2017-11-30 NOTE — Progress Notes (Signed)
Chief Complaint  Patient presents with  . New Patient (Initial Visit)   New patient  1. H/o anxiety/depression unknown triggers she uses essential oils to help and no interested in medications at this time  2. C/o GERD history but uses pure aloe and essential oils which helps and no longer has GERD.  3. Chronic neck pain radiating down right arm causing numbness. She has been to the chiropractor before which helps and also massage declines Xray today neck  4. C/o CP/pressure and SOB last week and she was not sure if related to anxiety. Declines Xray today of chest.     Review of Systems  Constitutional: Negative for weight loss.  HENT: Negative for hearing loss.   Eyes: Negative for blurred vision.  Respiratory: Positive for shortness of breath.   Cardiovascular: Positive for chest pain.  Gastrointestinal: Negative for heartburn.  Musculoskeletal: Positive for neck pain.  Skin:       +skin lesions on arms b/l   Neurological: Positive for sensory change.       +right arm numbness   Psychiatric/Behavioral: Negative for memory loss. The patient is nervous/anxious.    Past Medical History:  Diagnosis Date  . Allergy   . Anxiety   . Arthritis   . Chicken pox   . Depression   . Gastric ulcer    2/2 NSAIDS  . GERD (gastroesophageal reflux disease)   . Headache   . HPV in female   . Kidney stone   . Migraine    Past Surgical History:  Procedure Laterality Date  . COLONOSCOPY WITH PROPOFOL N/A 02/14/2015   Procedure: COLONOSCOPY WITH PROPOFOL;  Surgeon: Earline Mayotte, MD;  Location: [facility] ENDOSCOPY;  Service: Endoscopy;  Laterality: N/A;  . HERNIA REPAIR Left   . TONSILLECTOMY     1985   Family History  Problem Relation Age of Onset  . Arthritis Mother   . Hearing loss Mother   . Hypertension Mother   . Arthritis Father   . Hearing loss Father   . Hyperlipidemia Father   . Hypertension Father   . Arthritis Maternal Grandmother   . Alcohol abuse Maternal Grandfather    . Arthritis Maternal Grandfather   . Kidney disease Maternal Grandfather   . Arthritis Paternal Grandmother   . Cancer Paternal Grandmother        breast  . Arthritis Paternal Grandfather   . Heart disease Paternal Grandfather   . Hyperlipidemia Paternal Grandfather   . Hypertension Paternal Grandfather    Social History   Socioeconomic History  . Marital status: Married    Spouse name: Not on file  . Number of children: Not on file  . Years of education: Not on file  . Highest education level: Not on file  Occupational History  . Not on file  Social Needs  . Financial resource strain: Not on file  . Food insecurity:    Worry: Not on file    Inability: Not on file  . Transportation needs:    Medical: Not on file    Non-medical: Not on file  Tobacco Use  . Smoking status: Former Smoker    Years: 7.00    Last attempt to quit: 08/26/1992    Years since quitting: 25.2  . Smokeless tobacco: Never Used  Substance and Sexual Activity  . Alcohol use: Yes    Alcohol/week: 0.0 oz    Comment: occasionally  . Drug use: No  . Sexual activity: Not on file  Lifestyle  .  Physical activity:    Days per week: Not on file    Minutes per session: Not on file  . Stress: Not on file  Relationships  . Social connections:    Talks on phone: Not on file    Gets together: Not on file    Attends religious service: Not on file    Active member of club or organization: Not on file    Attends meetings of clubs or organizations: Not on file    Relationship status: Not on file  . Intimate partner violence:    Fear of current or ex partner: Not on file    Emotionally abused: Not on file    Physically abused: Not on file    Forced sexual activity: Not on file  Other Topics Concern  . Not on file  Social History Narrative   Married    3 kids    Teacher-Library    Masters degree   No outpatient medications have been marked as taking for the 11/27/17 encounter (Office Visit) with  McLean-Scocuzza, Pasty Spillersracy N, MD.   Allergies  Allergen Reactions  . Aleve [Naproxen] Swelling   No results found for this or any previous visit (from the past 2160 hour(s)). Objective  Body mass index is 24.48 kg/m. Wt Readings from Last 3 Encounters:  11/27/17 142 lb 9.6 oz (64.7 kg)  02/14/15 140 lb (63.5 kg)  01/26/15 141 lb (64 kg)   Temp Readings from Last 3 Encounters:  11/27/17 98.9 F (37.2 C) (Oral)  02/14/15 (!) 96.9 F (36.1 C) (Tympanic)   BP Readings from Last 3 Encounters:  11/27/17 112/60  02/14/15 101/65  01/26/15 102/60   Pulse Readings from Last 3 Encounters:  11/27/17 70  02/14/15 63  02/14/15 69    Physical Exam  Constitutional: She is oriented to person, place, and time. Vital signs are normal. She appears well-developed.  HENT:  Head: Normocephalic and atraumatic.  Mouth/Throat: Oropharynx is clear and moist and mucous membranes are normal.  Eyes: Pupils are equal, round, and reactive to light. Conjunctivae are normal.  Cardiovascular: Normal rate, regular rhythm and normal heart sounds.  No reproducible cp on exam   Pulmonary/Chest: Effort normal and breath sounds normal.  No reproducible cp on exam  Abdominal: Soft. Bowel sounds are normal. There is no tenderness.  Musculoskeletal:       Cervical back: She exhibits tenderness.       Back:  Neurological: She is alert and oriented to person, place, and time. Gait normal.  Skin: Skin is warm and dry.  aks to b/l forearms   Psychiatric: Her speech is normal and behavior is normal. Judgment and thought content normal. Her mood appears anxious. Cognition and memory are normal.  Nursing note and vitals reviewed.   Assessment   1. H/o anxiety seems uncontrolled/depression no complaint of depression today 2. GERD history sx's controlled  3. Chronic neck pain with possibly radicular sxs right arm  4. Chest pain and sob nonspecific could be related to #1  5. HM  Plan   1.  Pt declines  medications  Using essential oils  Will disc med apps at f/u  2.  Using essential oils and helping  3.  Disc consider neck xray in future  She has been to chiropractor and massage has helped  4.  Disc CXR today pt declines could be related to #1 see above  Also disc with pt consider cards referral she declines for now likely low risk 5.  Will have pt do labs [facility] form given today CMET, CBC, lipid, UA, TSH, T4, vit D, hep B/C.  -will need to add on hep B sAb quant when get labs done   Declines flu shot  Tdap unknown when last had  Disc shingrix at f/u  Hep B status will check  Pap get from [facility] 12/2014 will be due another pap upcoming f/u.  -H/o abnormal and LN2 in high school or younger teenage years Mammogram get from [place] 12/2014 referred mammogram today encouraged pt to sch Colonoscopy per pt had 02/14/15 repeat due in 10 years  rec pt see dermatology likely Aks on b/l arms with sun damage whe wants to hold on referral   Former smoker quit 1990 smoked 5-7 years max 0.5 ppd    Annual physical at f/u s/p labs   Dentist Dr. Stormy Fabian   Provider: Dr. French Ana McLean-Scocuzza-Internal Medicine

## 2017-12-15 ENCOUNTER — Encounter: Payer: Self-pay | Admitting: Internal Medicine

## 2017-12-24 ENCOUNTER — Other Ambulatory Visit: Payer: Self-pay | Admitting: Internal Medicine

## 2017-12-25 LAB — URINALYSIS, ROUTINE W REFLEX MICROSCOPIC
Bilirubin, UA: NEGATIVE
Glucose, UA: NEGATIVE
Ketones, UA: NEGATIVE
Nitrite, UA: NEGATIVE
Protein, UA: NEGATIVE
RBC, UA: NEGATIVE
Specific Gravity, UA: 1.027 (ref 1.005–1.030)
Urobilinogen, Ur: 0.2 mg/dL (ref 0.2–1.0)
pH, UA: 6.5 (ref 5.0–7.5)

## 2017-12-25 LAB — CBC WITH DIFFERENTIAL/PLATELET
BASOS ABS: 0 10*3/uL (ref 0.0–0.2)
Basos: 0 %
EOS (ABSOLUTE): 0.2 10*3/uL (ref 0.0–0.4)
Eos: 3 %
Hematocrit: 37.6 % (ref 34.0–46.6)
Hemoglobin: 12.5 g/dL (ref 11.1–15.9)
Immature Grans (Abs): 0 10*3/uL (ref 0.0–0.1)
Immature Granulocytes: 0 %
Lymphocytes Absolute: 1.7 10*3/uL (ref 0.7–3.1)
Lymphs: 26 %
MCH: 28.7 pg (ref 26.6–33.0)
MCHC: 33.2 g/dL (ref 31.5–35.7)
MCV: 86 fL (ref 79–97)
MONOCYTES: 7 %
Monocytes Absolute: 0.5 10*3/uL (ref 0.1–0.9)
NEUTROS ABS: 4.3 10*3/uL (ref 1.4–7.0)
Neutrophils: 64 %
Platelets: 257 10*3/uL (ref 150–379)
RBC: 4.35 x10E6/uL (ref 3.77–5.28)
RDW: 12.3 % (ref 12.3–15.4)
WBC: 6.8 10*3/uL (ref 3.4–10.8)

## 2017-12-25 LAB — COMPREHENSIVE METABOLIC PANEL WITH GFR
ALT: 12 IU/L (ref 0–32)
AST: 15 IU/L (ref 0–40)
Albumin/Globulin Ratio: 2.1 (ref 1.2–2.2)
Albumin: 4.9 g/dL (ref 3.5–5.5)
Alkaline Phosphatase: 96 IU/L (ref 39–117)
BUN/Creatinine Ratio: 19 (ref 9–23)
BUN: 18 mg/dL (ref 6–24)
Bilirubin Total: 0.5 mg/dL (ref 0.0–1.2)
CO2: 25 mmol/L (ref 20–29)
Calcium: 9.3 mg/dL (ref 8.7–10.2)
Chloride: 101 mmol/L (ref 96–106)
Creatinine, Ser: 0.95 mg/dL (ref 0.57–1.00)
GFR calc Af Amer: 79 mL/min/1.73
GFR calc non Af Amer: 69 mL/min/1.73
Globulin, Total: 2.3 g/dL (ref 1.5–4.5)
Glucose: 91 mg/dL (ref 65–99)
Potassium: 4.9 mmol/L (ref 3.5–5.2)
Sodium: 140 mmol/L (ref 134–144)
Total Protein: 7.2 g/dL (ref 6.0–8.5)

## 2017-12-25 LAB — LIPID PANEL W/O CHOL/HDL RATIO
Cholesterol, Total: 222 mg/dL — ABNORMAL HIGH (ref 100–199)
HDL: 60 mg/dL
LDL Calculated: 145 mg/dL — ABNORMAL HIGH (ref 0–99)
Triglycerides: 87 mg/dL (ref 0–149)
VLDL Cholesterol Cal: 17 mg/dL (ref 5–40)

## 2017-12-25 LAB — MICROSCOPIC EXAMINATION
BACTERIA UA: NONE SEEN
Casts: NONE SEEN /lpf

## 2017-12-25 LAB — HEPATITIS C ANTIBODY (REFLEX)

## 2017-12-25 LAB — HEPATITIS B SURFACE ANTIGEN: Hepatitis B Surface Ag: NEGATIVE

## 2017-12-25 LAB — T4, FREE: Free T4: 0.94 ng/dL (ref 0.82–1.77)

## 2017-12-25 LAB — TSH: TSH: 2.9 u[IU]/mL (ref 0.450–4.500)

## 2017-12-25 LAB — VITAMIN D 25 HYDROXY (VIT D DEFICIENCY, FRACTURES): Vit D, 25-Hydroxy: 32.6 ng/mL (ref 30.0–100.0)

## 2017-12-25 LAB — HEPATITIS B SURFACE ANTIBODY,QUALITATIVE: Hep B Surface Ab, Qual: NONREACTIVE

## 2017-12-25 LAB — HCV COMMENT:

## 2018-01-15 ENCOUNTER — Encounter: Payer: Self-pay | Admitting: Internal Medicine

## 2018-01-15 ENCOUNTER — Other Ambulatory Visit (HOSPITAL_COMMUNITY)
Admission: RE | Admit: 2018-01-15 | Discharge: 2018-01-15 | Disposition: A | Discharge status: Home / Self Care | Payer: BC Managed Care – PPO | Source: Ambulatory Visit | Attending: Internal Medicine | Admitting: Internal Medicine

## 2018-01-15 ENCOUNTER — Ambulatory Visit: Payer: BC Managed Care – PPO | Admitting: Internal Medicine

## 2018-01-15 VITALS — BP 104/56 | HR 72 | Temp 98.8°F | Ht 64.0 in | Wt 136.4 lb

## 2018-01-15 DIAGNOSIS — R102 Pelvic and perineal pain: Secondary | ICD-10-CM

## 2018-01-15 DIAGNOSIS — N926 Irregular menstruation, unspecified: Secondary | ICD-10-CM | POA: Insufficient documentation

## 2018-01-15 DIAGNOSIS — Z124 Encounter for screening for malignant neoplasm of cervix: Secondary | ICD-10-CM | POA: Diagnosis not present

## 2018-01-15 DIAGNOSIS — Z0001 Encounter for general adult medical examination with abnormal findings: Secondary | ICD-10-CM

## 2018-01-15 DIAGNOSIS — F419 Anxiety disorder, unspecified: Secondary | ICD-10-CM | POA: Diagnosis not present

## 2018-01-15 DIAGNOSIS — E785 Hyperlipidemia, unspecified: Secondary | ICD-10-CM | POA: Diagnosis not present

## 2018-01-15 DIAGNOSIS — Z1283 Encounter for screening for malignant neoplasm of skin: Secondary | ICD-10-CM | POA: Diagnosis not present

## 2018-01-15 DIAGNOSIS — N889 Noninflammatory disorder of cervix uteri, unspecified: Secondary | ICD-10-CM | POA: Diagnosis not present

## 2018-01-15 DIAGNOSIS — L570 Actinic keratosis: Secondary | ICD-10-CM | POA: Diagnosis not present

## 2018-01-15 NOTE — Progress Notes (Signed)
Chief Complaint  Patient presents with  . Follow-up   Physical/annual  1. Reviewed labs hld pt not exercising disc today  2. C/o neck pain and having right arm numbness appt next week with beshel for massage and chiropractor appt disc if they do Xrays to fax to [place] and Xrays neg rec MRI neck  3. Anxiety disc meditation today  4. Pt reports pelvic pain with sneezing and abnormal irregular cycles with heavy bleeding at times    Review of Systems  Constitutional: Negative for weight loss.  HENT: Negative for hearing loss.   Eyes: Negative for blurred vision.  Respiratory: Negative for shortness of breath.   Cardiovascular: Negative for chest pain.  Gastrointestinal: Negative for abdominal pain.  Genitourinary:       +irregular menses   Musculoskeletal: Positive for neck pain.  Skin: Negative for rash.       +lesions to arms   Neurological: Negative for headaches.  Psychiatric/Behavioral: The patient is nervous/anxious.    Past Medical History:  Diagnosis Date  . Allergy   . Anxiety   . Arthritis   . Chicken pox   . Depression   . Gastric ulcer    2/2 NSAIDS  . GERD (gastroesophageal reflux disease)   . Headache   . HPV in female   . Hyperlipidemia   . Kidney stone   . Migraine    Past Surgical History:  Procedure Laterality Date  . COLONOSCOPY WITH PROPOFOL N/A 02/14/2015   Procedure: COLONOSCOPY WITH PROPOFOL;  Surgeon: Earline Mayotte, MD;  Location: [facility] ENDOSCOPY;  Service: Endoscopy;  Laterality: N/A;  . HERNIA REPAIR Left   . TONSILLECTOMY     1985   Family History  Problem Relation Age of Onset  . Arthritis Mother   . Hearing loss Mother   . Hypertension Mother   . Arthritis Father   . Hearing loss Father   . Hyperlipidemia Father   . Hypertension Father   . Arthritis Maternal Grandmother   . Alcohol abuse Maternal Grandfather   . Arthritis Maternal Grandfather   . Kidney disease Maternal Grandfather   . Arthritis Paternal Grandmother   . Cancer  Paternal Grandmother        breast  . Arthritis Paternal Grandfather   . Heart disease Paternal Grandfather   . Hyperlipidemia Paternal Grandfather   . Hypertension Paternal Grandfather    Social History   Socioeconomic History  . Marital status: Married    Spouse name: Not on file  . Number of children: Not on file  . Years of education: Not on file  . Highest education level: Not on file  Occupational History  . Not on file  Social Needs  . Financial resource strain: Not on file  . Food insecurity:    Worry: Not on file    Inability: Not on file  . Transportation needs:    Medical: Not on file    Non-medical: Not on file  Tobacco Use  . Smoking status: Former Smoker    Years: 7.00    Last attempt to quit: 08/26/1992    Years since quitting: 25.4  . Smokeless tobacco: Never Used  Substance and Sexual Activity  . Alcohol use: Yes    Alcohol/week: 0.0 oz    Comment: occasionally  . Drug use: No  . Sexual activity: Not on file  Lifestyle  . Physical activity:    Days per week: Not on file    Minutes per session: Not on file  .  Stress: Not on file  Relationships  . Social connections:    Talks on phone: Not on file    Gets together: Not on file    Attends religious service: Not on file    Active member of club or organization: Not on file    Attends meetings of clubs or organizations: Not on file    Relationship status: Not on file  . Intimate partner violence:    Fear of current or ex partner: Not on file    Emotionally abused: Not on file    Physically abused: Not on file    Forced sexual activity: Not on file  Other Topics Concern  . Not on file  Social History Narrative   Married    3 kids    Teacher-Library    Masters degree   Feels safe in relationship, wears seat belt, owns guns    No outpatient medications have been marked as taking for the 01/15/18 encounter (Office Visit) with McLean-Scocuzza, Pasty Spillers, MD.   Allergies  Allergen Reactions  .  Aleve [Naproxen] Swelling   Recent Results (from the past 2160 hour(s))  CBC with Differential/Platelet     Status: None   Collection Time: 12/24/17  8:33 AM  Result Value Ref Range   WBC 6.8 3.4 - 10.8 x10E3/uL   RBC 4.35 3.77 - 5.28 x10E6/uL   Hemoglobin 12.5 11.1 - 15.9 g/dL   Hematocrit 09.3 81.8 - 46.6 %   MCV 86 79 - 97 fL   MCH 28.7 26.6 - 33.0 pg   MCHC 33.2 31.5 - 35.7 g/dL   RDW 29.9 37.1 - 69.6 %   Platelets 257 150 - 379 x10E3/uL   Neutrophils 64 Not Estab. %   Lymphs 26 Not Estab. %   Monocytes 7 Not Estab. %   Eos 3 Not Estab. %   Basos 0 Not Estab. %   Neutrophils Absolute 4.3 1.4 - 7.0 x10E3/uL   Lymphocytes Absolute 1.7 0.7 - 3.1 x10E3/uL   Monocytes Absolute 0.5 0.1 - 0.9 x10E3/uL   EOS (ABSOLUTE) 0.2 0.0 - 0.4 x10E3/uL   Basophils Absolute 0.0 0.0 - 0.2 x10E3/uL   Immature Granulocytes 0 Not Estab. %   Immature Grans (Abs) 0.0 0.0 - 0.1 x10E3/uL  Comprehensive metabolic panel     Status: None   Collection Time: 12/24/17  8:33 AM  Result Value Ref Range   Glucose 91 65 - 99 mg/dL   BUN 18 6 - 24 mg/dL   Creatinine, Ser 7.89 0.57 - 1.00 mg/dL   GFR calc non Af Amer 69 >59 mL/min/1.73   GFR calc Af Amer 79 >59 mL/min/1.73   BUN/Creatinine Ratio 19 9 - 23   Sodium 140 134 - 144 mmol/L   Potassium 4.9 3.5 - 5.2 mmol/L   Chloride 101 96 - 106 mmol/L   CO2 25 20 - 29 mmol/L   Calcium 9.3 8.7 - 10.2 mg/dL   Total Protein 7.2 6.0 - 8.5 g/dL   Albumin 4.9 3.5 - 5.5 g/dL   Globulin, Total 2.3 1.5 - 4.5 g/dL   Albumin/Globulin Ratio 2.1 1.2 - 2.2   Bilirubin Total 0.5 0.0 - 1.2 mg/dL   Alkaline Phosphatase 96 39 - 117 IU/L   AST 15 0 - 40 IU/L   ALT 12 0 - 32 IU/L  Urinalysis, Routine w reflex microscopic     Status: Abnormal   Collection Time: 12/24/17  8:33 AM  Result Value Ref Range   Specific Gravity, UA  1.027 1.005 - 1.030   pH, UA 6.5 5.0 - 7.5   Color, UA Yellow Yellow   Appearance Ur Clear Clear   Leukocytes, UA 1+ (A) Negative   Protein, UA  Negative Negative/Trace   Glucose, UA Negative Negative   Ketones, UA Negative Negative   RBC, UA Negative Negative   Bilirubin, UA Negative Negative   Urobilinogen, Ur 0.2 0.2 - 1.0 mg/dL   Nitrite, UA Negative Negative   Microscopic Examination See below:     Comment: Microscopic was indicated and was performed.  Microscopic Examination     Status: Abnormal   Collection Time: 12/24/17  8:33 AM  Result Value Ref Range   WBC, UA 6-10 (A) 0 - 5 /hpf   RBC, UA 0-2 0 - 2 /hpf   Epithelial Cells (non renal) 0-10 0 - 10 /hpf   Casts None seen None seen /lpf   Mucus, UA Present Not Estab.   Bacteria, UA None seen None seen/Few  Lipid Panel w/o Chol/HDL Ratio     Status: Abnormal   Collection Time: 12/24/17  8:33 AM  Result Value Ref Range   Cholesterol, Total 222 (H) 100 - 199 mg/dL   Triglycerides 87 0 - 149 mg/dL   HDL 60 >27 mg/dL   VLDL Cholesterol Cal 17 5 - 40 mg/dL   LDL Calculated 253 (H) 0 - 99 mg/dL  T4, free     Status: None   Collection Time: 12/24/17  8:33 AM  Result Value Ref Range   Free T4 0.94 0.82 - 1.77 ng/dL  TSH     Status: None   Collection Time: 12/24/17  8:33 AM  Result Value Ref Range   TSH 2.900 0.450 - 4.500 uIU/mL  Hepatitis B surface antibody     Status: None   Collection Time: 12/24/17  8:33 AM  Result Value Ref Range   Hep B Surface Ab, Qual Non Reactive     Comment:               Non Reactive: Inconsistent with immunity,                             less than 10 mIU/mL               Reactive:     Consistent with immunity,                             greater than 9.9 mIU/mL   VITAMIN D 25 Hydroxy (Vit-D Deficiency, Fractures)     Status: None   Collection Time: 12/24/17  8:33 AM  Result Value Ref Range   Vit D, 25-Hydroxy 32.6 30.0 - 100.0 ng/mL    Comment: Vitamin D deficiency has been defined by the Institute of Medicine and an Endocrine Society practice guideline as a level of serum 25-OH vitamin D less than 20 ng/mL (1,2). The Endocrine  Society went on to further define vitamin D insufficiency as a level between 21 and 29 ng/mL (2). 1. IOM (Institute of Medicine). 2010. Dietary reference    intakes for calcium and D. [place]: The    [employer]. 2. Holick MF, Binkley [place], Bischoff-Ferrari HA, et al.    Evaluation, treatment, and prevention of vitamin D    deficiency: an Endocrine Society clinical practice    guideline. JCEM. 2011 Jul; 96(7):1911-30.   Hepatitis c antibody (  reflex)     Status: None   Collection Time: 12/24/17  8:33 AM  Result Value Ref Range   HCV Ab <0.1 0.0 - 0.9 s/co ratio  Hepatitis B surface antigen     Status: None   Collection Time: 12/24/17  8:33 AM  Result Value Ref Range   Hepatitis B Surface Ag Negative Negative  HCV Comment:     Status: None   Collection Time: 12/24/17  8:33 AM  Result Value Ref Range   Comment: Comment     Comment: Non reactive HCV antibody screen is consistent with no HCV infection, unless recent infection is suspected or other evidence exists to indicate HCV infection.    Objective  Body mass index is 23.41 kg/m. Wt Readings from Last 3 Encounters:  01/15/18 136 lb 6.4 oz (61.9 kg)  11/27/17 142 lb 9.6 oz (64.7 kg)  02/14/15 140 lb (63.5 kg)   Temp Readings from Last 3 Encounters:  01/15/18 98.8 F (37.1 C) (Oral)  11/27/17 98.9 F (37.2 C) (Oral)  02/14/15 (!) 96.9 F (36.1 C) (Tympanic)   BP Readings from Last 3 Encounters:  01/15/18 (!) 104/56  11/27/17 112/60  02/14/15 101/65   Pulse Readings from Last 3 Encounters:  01/15/18 72  11/27/17 70  02/14/15 63    Physical Exam  Constitutional: She is oriented to person, place, and time. Vital signs are normal. She appears well-developed and well-nourished. She is cooperative.  HENT:  Head: Normocephalic and atraumatic.  Mouth/Throat: Oropharynx is clear and moist and mucous membranes are normal.  Eyes: Pupils are equal, round, and reactive to light. Conjunctivae are normal.    Cardiovascular: Normal rate, regular rhythm and normal heart sounds.  Pulmonary/Chest: Effort normal and breath sounds normal.  Abdominal: Soft. Bowel sounds are normal. There is no tenderness.  Genitourinary: Vagina normal and uterus normal. Pelvic exam was performed with patient supine. Cervix exhibits friability. Cervix exhibits no motion tenderness and no discharge. Right adnexum displays no mass, no tenderness and no fullness. Left adnexum displays no mass, no tenderness and no fullness.    Neurological: She is alert and oriented to person, place, and time. Gait normal.  Skin: Skin is warm, dry and intact.  Aks to b/l arms    Psychiatric: She has a normal mood and affect. Her speech is normal and behavior is normal. Judgment and thought content normal. Cognition and memory are normal.  Nursing note and vitals reviewed.   Assessment   1. Annual  2. Anxiety  3. HLD  4. Pelvic pain and cervical os lesion ? Polyp vs other with heavy menses 5. Aks to arms  6. Chronic neck pain with cervical radiculopathy down right arm  Plan   1.  Declines flu shot  Tdap unknown when last had pt will check with school  Disc shingrix at f/u  Hep B rec given info to read about today   Colonoscopy per pt had 02/14/15 repeat due in 10 years Former smoker quit 1990 smoked 5-7 years max 0.5 ppd  Pap today refer to OB/GYN Dr. Bonney Aid for #4 h/o abnormal pap and LN2 cervix in HS Pt to sch mammo TBD Reviewed labs  rec healthy diet choices and exercise  2. Disc meditation apps for phone  3. Given cholesterol info  Repeat in 6 months  4. Refer to [facility] Dr. Bonney Aid to check out cervix and os lesion which maybe polyp  5. Refer to Dr. Roseanne Kaufman tbse  6. Pending appt with beshel  Provider: Dr. Olivia Mackie McLean-Scocuzza-Internal Medicine

## 2018-01-15 NOTE — Patient Instructions (Addendum)
Check on date of Tdap date  Meditation apps insight timer, headspace and calm  We referred you to [facility] and Dr. Roseanne Kaufman dermatology  Please sch mammogram   Results for DAMARYS, SPEIR (MRN [id number]) as of 01/15/2018 16:05  Ref. Range 12/24/2017 08:33  Sodium Latest Ref Range: 134 - 144 mmol/L 140  Potassium Latest Ref Range: 3.5 - 5.2 mmol/L 4.9  Chloride Latest Ref Range: 96 - 106 mmol/L 101  CO2 Latest Ref Range: 20 - 29 mmol/L 25  Glucose Latest Ref Range: 65 - 99 mg/dL 91  BUN Latest Ref Range: 6 - 24 mg/dL 18  Creatinine Latest Ref Range: 0.57 - 1.00 mg/dL 0.34  Calcium Latest Ref Range: 8.7 - 10.2 mg/dL 9.3  BUN/Creatinine Ratio Latest Ref Range: 9 - 23  19  Alkaline Phosphatase Latest Ref Range: 39 - 117 IU/L 96  Albumin Latest Ref Range: 3.5 - 5.5 g/dL 4.9  Albumin/Globulin Ratio Latest Ref Range: 1.2 - 2.2  2.1  AST Latest Ref Range: 0 - 40 IU/L 15  ALT Latest Ref Range: 0 - 32 IU/L 12  Total Protein Latest Ref Range: 6.0 - 8.5 g/dL 7.2  Total Bilirubin Latest Ref Range: 0.0 - 1.2 mg/dL 0.5  GFR, Est Non African American Latest Ref Range: >59 mL/min/1.73 69  GFR, Est African American Latest Ref Range: >59 mL/min/1.73 79  Cholesterol, Total Latest Ref Range: 100 - 199 mg/dL 742 (H)  HDL Cholesterol Latest Ref Range: >39 mg/dL 60  LDL (calc) Latest Ref Range: 0 - 99 mg/dL 595 (H)  Triglycerides Latest Ref Range: 0 - 149 mg/dL 87  VLDL Cholesterol Cal Latest Ref Range: 5 - 40 mg/dL 17  Vitamin D, 63-OVFIEPP Latest Ref Range: 30.0 - 100.0 ng/mL 32.6  Globulin, Total Latest Ref Range: 1.5 - 4.5 g/dL 2.3  WBC Latest Ref Range: 3.4 - 10.8 x10E3/uL 6.8  RBC Latest Ref Range: 3.77 - 5.28 x10E6/uL 4.35  Hemoglobin Latest Ref Range: 11.1 - 15.9 g/dL 29.5  HCT Latest Ref Range: 34.0 - 46.6 % 37.6  MCV Latest Ref Range: 79 - 97 fL 86  MCH Latest Ref Range: 26.6 - 33.0 pg 28.7  MCHC Latest Ref Range: 31.5 - 35.7 g/dL 18.8  RDW Latest Ref Range: 12.3 - 15.4 % 12.3   Platelets Latest Ref Range: 150 - 379 x10E3/uL 257  Neutrophils Latest Ref Range: Not Estab. % 64  NEUT# Latest Ref Range: 1.4 - 7.0 x10E3/uL 4.3  Lymphocyte # Latest Ref Range: 0.7 - 3.1 x10E3/uL 1.7  Monocytes Absolute Latest Ref Range: 0.1 - 0.9 x10E3/uL 0.5  Basophils Absolute Latest Ref Range: 0.0 - 0.2 x10E3/uL 0.0  Immature Grans (Abs) Latest Ref Range: 0.0 - 0.1 x10E3/uL 0.0  Lymphs Latest Ref Range: Not Estab. % 26  Monocytes Latest Ref Range: Not Estab. % 7  Basos Latest Ref Range: Not Estab. % 0  Immature Granulocytes Latest Ref Range: Not Estab. % 0  Eos Latest Ref Range: Not Estab. % 3  EOS (ABSOLUTE) Latest Ref Range: 0.0 - 0.4 x10E3/uL 0.2  TSH Latest Ref Range: 0.450 - 4.500 uIU/mL 2.900  T4,Free(Direct) Latest Ref Range: 0.82 - 1.77 ng/dL 4.16  Hepatitis B Surface Ag Latest Ref Range: Negative  Negative  Hep B Surface Ab, Qual Unknown Non Reactive  Comment: Unknown Comment  URINALYSIS, ROUTINE W REFLEX MICROSCOPIC Unknown Rpt (A)  Appearance Ur Latest Ref Range: Clear  Clear  Bilirubin, UA Latest Ref Range: Negative  Negative  Color, UA  Latest Ref Range: Yellow  Yellow  Glucose Latest Ref Range: Negative  Negative  Ketones, UA Latest Ref Range: Negative  Negative  Leukocytes, UA Latest Ref Range: Negative  1+ (A)  Nitrite, UA Latest Ref Range: Negative  Negative  pH, UA Latest Ref Range: 5.0 - 7.5  6.5  Protein, UA Latest Ref Range: Negative/Trace  Negative  Specific Gravity, UA Latest Ref Range: 1.005 - 1.030  1.027  Bacteria, UA Latest Ref Range: None seen/Few  None seen  Casts Latest Ref Range: None seen /lpf None seen  Epithelial Cells (non renal) Latest Ref Range: 0 - 10 /hpf 0-10  Microscopic Examination Unknown See below:  Mucus, UA Latest Ref Range: Not Estab.  Present  RBC, UA Latest Ref Range: 0 - 2 /hpf 0-2  RBC, UA Latest Ref Range: Negative  Negative  WBC, UA Latest Ref Range: 0 - 5 /hpf 6-10 (A)  Urobilinogen, Ur Latest Ref Range: 0.2 - 1.0 mg/dL  0.2    Results for LENEA, BYWATER (MRN [id number]) as of 01/15/2018 16:05  Ref. Range 12/24/2017 08:33  Appearance Ur Latest Ref Range: Clear  Clear  Bilirubin, UA Latest Ref Range: Negative  Negative  Color, UA Latest Ref Range: Yellow  Yellow  Glucose Latest Ref Range: Negative  Negative  Ketones, UA Latest Ref Range: Negative  Negative  Leukocytes, UA Latest Ref Range: Negative  1+ (A)  Nitrite, UA Latest Ref Range: Negative  Negative  pH, UA Latest Ref Range: 5.0 - 7.5  6.5  Protein, UA Latest Ref Range: Negative/Trace  Negative  Specific Gravity, UA Latest Ref Range: 1.005 - 1.030  1.027  Bacteria, UA Latest Ref Range: None seen/Few  None seen  Casts Latest Ref Range: None seen /lpf None seen  Epithelial Cells (non renal) Latest Ref Range: 0 - 10 /hpf 0-10  Microscopic Examination Unknown See below:  Mucus, UA Latest Ref Range: Not Estab.  Present  RBC, UA Latest Ref Range: 0 - 2 /hpf 0-2  RBC, UA Latest Ref Range: Negative  Negative  WBC, UA Latest Ref Range: 0 - 5 /hpf 6-10 (A)  Urobilinogen, Ur Latest Ref Range: 0.2 - 1.0 mg/dL 0.2    Cholesterol Cholesterol is a white, waxy, fat-like substance that is needed by the human body in small amounts. The liver makes all the cholesterol we need. Cholesterol is carried from the liver by the blood through the blood vessels. Deposits of cholesterol (plaques) may build up on blood vessel (artery) walls. Plaques make the arteries narrower and stiffer. Cholesterol plaques increase the risk for heart attack and stroke. You cannot feel your cholesterol level even if it is very high. The only way to know that it is high is to have a blood test. Once you know your cholesterol levels, you should keep a record of the test results. Work with your health care provider to keep your levels in the desired range. What do the results mean?  Total cholesterol is a rough measure of all the cholesterol in your blood.  LDL (low-density lipoprotein) is the "bad"  cholesterol. This is the type that causes plaque to build up on the artery walls. You want this level to be low.  HDL (high-density lipoprotein) is the "good" cholesterol because it cleans the arteries and carries the LDL away. You want this level to be high.  Triglycerides are fat that the body can either burn for energy or store. High levels are closely linked to heart disease. What are  the desired levels of cholesterol?  Total cholesterol below 200.  LDL below 100 for people who are at risk, below 70 for people at very high risk.  HDL above 40 is good. A level of 60 or higher is considered to be protective against heart disease.  Triglycerides below 150. How can I lower my cholesterol? Diet Follow your diet program as told by your health care provider.  Choose fish or white meat chicken and [place], roasted or baked. Limit fatty cuts of red meat, fried foods, and processed meats, such as sausage and lunch meats.  Eat lots of fresh fruits and vegetables.  Choose whole grains, beans, pasta, potatoes, and cereals.  Choose olive oil, corn oil, or canola oil, and use only small amounts.  Avoid butter, mayonnaise, shortening, or palm kernel oils.  Avoid foods with trans fats.  Drink skim or nonfat milk and eat low-fat or nonfat yogurt and cheeses. Avoid whole milk, cream, ice cream, egg yolks, and full-fat cheeses.  Healthier desserts include angel food cake, ginger snaps, animal crackers, hard candy, popsicles, and low-fat or nonfat frozen yogurt. Avoid pastries, cakes, pies, and cookies.  Exercise  Follow your exercise program as told by your health care provider. A regular program: ? Helps to decrease LDL and raise HDL. ? Helps with weight control.  Do things that increase your activity level, such as gardening, walking, and taking the stairs.  Ask your health care provider about ways that you can be more active in your daily life.  Medicine  Take over-the-counter and  prescription medicines only as told by your health care provider. ? Medicine may be prescribed by your health care provider to help lower cholesterol and decrease the risk for heart disease. This is usually done if diet and exercise have failed to bring down cholesterol levels. ? If you have several risk factors, you may need medicine even if your levels are normal.  This information is not intended to replace advice given to you by your health care provider. Make sure you discuss any questions you have with your health care provider. Document Released: 05/07/2001 Document Revised: 03/09/2016 Document Reviewed: 02/10/2016 Elsevier Interactive Patient Education  2018 [employer].  Tdap/DTaP Vaccine (Diphtheria, Tetanus, and Pertussis): What You Need to Know 1. Why get vaccinated? Diphtheria, tetanus, and pertussis are serious diseases caused by bacteria. Diphtheria and pertussis are spread from person to person. Tetanus enters the body through cuts or wounds. DIPHTHERIA causes a thick covering in the back of the throat.  It can lead to breathing problems, paralysis, heart failure, and even death.  TETANUS (Lockjaw) causes painful tightening of the muscles, usually all over the body.  It can lead to "locking" of the jaw so the victim cannot open his mouth or swallow. Tetanus leads to death in up to 2 out of 10 cases.  PERTUSSIS (Whooping Cough) causes coughing spells so bad that it is hard for infants to eat, drink, or breathe. These spells can last for weeks.  It can lead to pneumonia, seizures (jerking and staring spells), brain damage, and death.  Diphtheria, tetanus, and pertussis vaccine (DTaP) can help prevent these diseases. Most children who are vaccinated with DTaP will be protected throughout childhood. Many more children would get these diseases if we stopped vaccinating. DTaP is a safer version of an older vaccine called DTP. DTP is no longer used in the [place]. 2. Who  should get DTaP vaccine and when? Children should get 5 doses of DTaP  vaccine, one dose at each of the following ages:  2 months  4 months  6 months  15-18 months  4-6 years  DTaP may be given at the same time as other vaccines. 3. Some children should not get DTaP vaccine or should wait  Children with minor illnesses, such as a cold, may be vaccinated. But children who are moderately or severely ill should usually wait until they recover before getting DTaP vaccine.  Any child who had a life-threatening allergic reaction after a dose of DTaP should not get another dose.  Any child who suffered a brain or nervous system disease within 7 days after a dose of DTaP should not get another dose.  Talk with your doctor if your child: ? had a seizure or collapsed after a dose of DTaP, ? cried non-stop for 3 hours or more after a dose of DTaP, ? had a fever over 105F after a dose of DTaP. Ask your doctor for more information. Some of these children should not get another dose of pertussis vaccine, but may get a vaccine without pertussis, called DT. 4. Older children and adults DTaP is not licensed for adolescents, adults, or children 53 years of age and older. But older people still need protection. A vaccine called Tdap is similar to DTaP. A single dose of Tdap is recommended for people 11 through 54 years of age. Another vaccine, called Td, protects against tetanus and diphtheria, but not pertussis. It is recommended every 10 years. There are separate Vaccine Information Statements for these vaccines. 5. What are the risks from DTaP vaccine? Getting diphtheria, tetanus, or pertussis disease is much riskier than getting DTaP vaccine. However, a vaccine, like any medicine, is capable of causing serious problems, such as severe allergic reactions. The risk of DTaP vaccine causing serious harm, or death, is extremely small. Mild problems (common)  Fever (up to about 1 child in 4)  Redness  or swelling where the shot was given (up to about 1 child in 4)  Soreness or tenderness where the shot was given (up to about 1 child in 4) These problems occur more often after the 4th and 5th doses of the DTaP series than after earlier doses. Sometimes the 4th or 5th dose of DTaP vaccine is followed by swelling of the entire arm or leg in which the shot was given, lasting 1-7 days (up to about 1 child in 30). Other mild problems include:  Fussiness (up to about 1 child in 3)  Tiredness or poor appetite (up to about 1 child in 10)  Vomiting (up to about 1 child in 50) These problems generally occur 1-3 days after the shot. Moderate problems (uncommon)  Seizure (jerking or staring) (about 1 child out of 14,000)  Non-stop crying, for 3 hours or more (up to about 1 child out of 1,000)  High fever, over 105F (about 1 child out of 16,000) Severe problems (very rare)  Serious allergic reaction (less than 1 out of a million doses)  Several other severe problems have been reported after DTaP vaccine. These include: ? Long-term seizures, coma, or lowered consciousness ? Permanent brain damage. These are so rare it is hard to tell if they are caused by the vaccine. Controlling fever is especially important for children who have had seizures, for any reason. It is also important if another family member has had seizures. You can reduce fever and pain by giving your child an aspirin-free pain reliever when the shot is given,  and for the next 24 hours, following the package instructions. 6. What if there is a serious reaction? What should I look for? Look for anything that concerns you, such as signs of a severe allergic reaction, very high fever, or behavior changes. Signs of a severe allergic reaction can include hives, swelling of the face and throat, difficulty breathing, a fast heartbeat, dizziness, and weakness. These would start a few minutes to a few hours after the vaccination. What  should I do?  If you think it is a severe allergic reaction or other emergency that can't wait, call 9-1-1 or get the person to the nearest hospital. Otherwise, call your doctor.  Afterward, the reaction should be reported to the Vaccine Adverse Event Reporting System (VAERS). Your doctor might file this report, or you can do it yourself through the VAERS web site at www.vaers.LAgents.no, or by calling 1-(313) 209-0010. ? VAERS is only for reporting reactions. They do not give medical advice. 7. The National Vaccine Injury Compensation Program The [employer] Vaccine Injury Compensation Program (VICP) is a federal program that was created to compensate people who may have been injured by certain vaccines. Persons who believe they may have been injured by a vaccine can learn about the program and about filing a claim by calling 1-817-797-8811 or visiting the VICP website at SpiritualWord.at. 8. How can I learn more?  Ask your doctor.  Call your local or state health department.  Contact the Centers for Disease Control and Prevention (CDC): ? Call (778)133-5192 (1-800-CDC-INFO) or ? Visit CDC's website at PicCapture.uy CDC DTaP Vaccine (Diphtheria, Tetanus, and Pertussis) VIS (01/09/06) This information is not intended to replace advice given to you by your health care provider. Make sure you discuss any questions you have with your health care provider. Document Released: 06/09/2006 Document Revised: 05/02/2016 Document Reviewed: 05/02/2016 Elsevier Interactive Patient Education  2017 [employer] Inc.  Hepatitis B Vaccine, Recombinant injection What is this medicine? HEPATITIS B VACCINE (hep uh TAHY tis B VAK seen) is a vaccine. It is used to prevent an infection with the hepatitis B virus. This medicine may be used for other purposes; ask your health care provider or pharmacist if you have questions. COMMON BRAND NAME(S): Engerix-B, Recombivax HB What should I tell my health  care provider before I take this medicine? They need to know if you have any of these conditions: -fever, infection -heart disease -hepatitis B infection -immune system problems -kidney disease -an unusual or allergic reaction to vaccines, yeast, other medicines, foods, dyes, or preservatives -pregnant or trying to get pregnant -breast-feeding How should I use this medicine? This vaccine is for injection into a muscle. It is given by a health care professional. A copy of Vaccine Information Statements will be given before each vaccination. Read this sheet carefully each time. The sheet may change frequently. Talk to your pediatrician regarding the use of this medicine in children. While this drug may be prescribed for children as young as newborn for selected conditions, precautions do apply. Overdosage: If you think you have taken too much of this medicine contact a poison control center or emergency room at once. NOTE: This medicine is only for you. Do not share this medicine with others. What if I miss a dose? It is important not to miss your dose. Call your doctor or health care professional if you are unable to keep an appointment. What may interact with this medicine? -medicines that suppress your immune function like adalimumab, anakinra, infliximab -medicines to treat cancer -  steroid medicines like prednisone or cortisone This list may not describe all possible interactions. Give your health care provider a list of all the medicines, herbs, non-prescription drugs, or dietary supplements you use. Also tell them if you smoke, drink alcohol, or use illegal drugs. Some items may interact with your medicine. What should I watch for while using this medicine? See your health care provider for all shots of this vaccine as directed. You must have 3 shots of this vaccine for protection from hepatitis B infection. Tell your doctor right away if you have any serious or unusual side effects after  getting this vaccine. What side effects may I notice from receiving this medicine? Side effects that you should report to your doctor or health care professional as soon as possible: -allergic reactions like skin rash, itching or hives, swelling of the face, lips, or tongue -breathing problems -confused, irritated -fast, irregular heartbeat -flu-like syndrome -numb, tingling pain -seizures -unusually weak or tired Side effects that usually do not require medical attention (report to your doctor or health care professional if they continue or are bothersome): -diarrhea -fever -headache -loss of appetite -muscle pain -nausea -pain, redness, swelling, or irritation at site where injected -tiredness This list may not describe all possible side effects. Call your doctor for medical advice about side effects. You may report side effects to FDA at 1-800-FDA-1088. Where should I keep my medicine? This drug is given in a hospital or clinic and will not be stored at home. NOTE: This sheet is a summary. It may not cover all possible information. If you have questions about this medicine, talk to your doctor, pharmacist, or health care provider.  2018 Elsevier/Gold Standard (2013-12-13 13:26:01)

## 2018-01-15 NOTE — Progress Notes (Signed)
Pre visit review using our clinic review tool, if applicable. No additional management support is needed unless otherwise documented below in the visit note. 

## 2018-01-21 LAB — CYTOLOGY - PAP
Diagnosis: NEGATIVE
HPV: NOT DETECTED

## 2018-02-10 ENCOUNTER — Ambulatory Visit (INDEPENDENT_AMBULATORY_CARE_PROVIDER_SITE_OTHER): Payer: BC Managed Care – PPO | Admitting: Obstetrics and Gynecology

## 2018-02-10 ENCOUNTER — Encounter: Payer: Self-pay | Admitting: Obstetrics and Gynecology

## 2018-02-10 ENCOUNTER — Ambulatory Visit
Admission: RE | Admit: 2018-02-10 | Discharge: 2018-02-10 | Disposition: A | Discharge status: Home / Self Care | Payer: BC Managed Care – PPO | Source: Ambulatory Visit | Attending: Internal Medicine | Admitting: Internal Medicine

## 2018-02-10 VITALS — BP 100/60 | HR 69 | Ht 64.0 in | Wt 140.0 lb

## 2018-02-10 DIAGNOSIS — N924 Excessive bleeding in the premenopausal period: Secondary | ICD-10-CM

## 2018-02-10 DIAGNOSIS — N870 Mild cervical dysplasia: Secondary | ICD-10-CM | POA: Insufficient documentation

## 2018-02-10 DIAGNOSIS — Z1231 Encounter for screening mammogram for malignant neoplasm of breast: Secondary | ICD-10-CM

## 2018-02-10 DIAGNOSIS — R102 Pelvic and perineal pain: Secondary | ICD-10-CM

## 2018-02-10 DIAGNOSIS — N889 Noninflammatory disorder of cervix uteri, unspecified: Secondary | ICD-10-CM | POA: Diagnosis not present

## 2018-02-10 NOTE — Progress Notes (Signed)
Gynecology Pelvic Pain Evaluation   Chief Complaint:  Chief Complaint  Patient presents with  . vaginal lesion    [facility]    History of Present Illness:   Patient is a 52 y.o. G3P3 who LMP was Patient's last menstrual period was 01/18/2018., presents today for a problem visit.  She complains of abnormal bleeding and pain.   Her pain is localized to the RLQ and LLQ area, described as constant, began several months ago and its severity is described as moderate. The pain radiates to the  bilateral lower quadrant. The patient is unable to relate any inciting events for her pain such as food intake, bowl movements, intercourse, or menses.  She does report having had a normal colonoscopy at age 52, recent pap smear obtained was NIL, HPV negative with a cervical lesion noted at that time.    She still menstruates but intervals have become irregular and heavier. No exogenous hormone exposure   Previous evaluation: normal pap 01/15/18, Ultrasound with Dr. Luella Cookosenow on 01/24/2015 normal uterus, ovaries, and endometrial stripe of 6.818mm (At that time pain and bloating was mostly noted in the LLQ).  Also prior EGD with Dr. Jerre SimonEliott on 12/19/2011 with biopsies showing gastritis, also normal HIDA scan around that time.   Prior Diagnosis: PMDD Previous Treatment: none.  Review of Systems: Review of Systems  Constitutional: Negative for chills and fever.  HENT: Negative for congestion.   Respiratory: Negative for cough and shortness of breath.   Cardiovascular: Negative for chest pain and palpitations.  Gastrointestinal: Positive for abdominal pain. Negative for constipation, diarrhea, heartburn, nausea and vomiting.  Genitourinary: Negative for dysuria, frequency and urgency.  Skin: Negative for itching and rash.  Neurological: Negative for dizziness and headaches.  Endo/Heme/Allergies: Negative for polydipsia.    Past Medical History:  Past Medical History:  Diagnosis Date  . Allergy     . Anxiety   . Arthritis   . Chicken pox   . Depression   . Gastric ulcer    2/2 NSAIDS  . GERD (gastroesophageal reflux disease)   . Headache   . HPV in female   . Hyperlipidemia   . Kidney stone   . Migraine     Past Surgical History:  Past Surgical History:  Procedure Laterality Date  . COLONOSCOPY WITH PROPOFOL N/A 02/14/2015   Procedure: COLONOSCOPY WITH PROPOFOL;  Surgeon: Earline MayotteJeffrey W Byrnett, MD;  [facility];  Service: Endoscopy;  Laterality: N/A;  . HERNIA REPAIR Left   . TONSILLECTOMY     1985  . UPPER GI ENDOSCOPY      Gynecologic History:  Patient's last menstrual period was 01/18/2018.  Obstetric History: G3P3  Family History:  Family History  Problem Relation Age of Onset  . Arthritis Mother   . Hearing loss Mother   . Hypertension Mother   . Arthritis Father   . Hearing loss Father   . Hyperlipidemia Father   . Hypertension Father   . Arthritis Maternal Grandmother   . Alcohol abuse Maternal Grandfather   . Arthritis Maternal Grandfather   . Kidney disease Maternal Grandfather   . Arthritis Paternal Grandmother   . Cancer Paternal Grandmother        breast  . Arthritis Paternal Grandfather   . Heart disease Paternal Grandfather   . Hyperlipidemia Paternal Grandfather   . Hypertension Paternal Grandfather     Social History:  Social History   Socioeconomic History  . Marital status: Married  Spouse name: Not on file  . Number of children: Not on file  . Years of education: Not on file  . Highest education level: Not on file  Occupational History  . Not on file  Social Needs  . Financial resource strain: Not on file  . Food insecurity:    Worry: Not on file    Inability: Not on file  . Transportation needs:    Medical: Not on file    Non-medical: Not on file  Tobacco Use  . Smoking status: Former Smoker    Years: 7.00    Last attempt to quit: 08/26/1992    Years since quitting: 25.4  . Smokeless tobacco: Never Used   Substance and Sexual Activity  . Alcohol use: Yes    Alcohol/week: 0.0 oz    Comment: occasionally  . Drug use: No  . Sexual activity: Yes    Birth control/protection: None  Lifestyle  . Physical activity:    Days per week: Not on file    Minutes per session: Not on file  . Stress: Not on file  Relationships  . Social connections:    Talks on phone: Not on file    Gets together: Not on file    Attends religious service: Not on file    Active member of club or organization: Not on file    Attends meetings of clubs or organizations: Not on file    Relationship status: Not on file  . Intimate partner violence:    Fear of current or ex partner: Not on file    Emotionally abused: Not on file    Physically abused: Not on file    Forced sexual activity: Not on file  Other Topics Concern  . Not on file  Social History Narrative   Married    3 kids    Teacher-Library    Masters degree   Feels safe in relationship, wears seat belt, owns guns     Allergies:  Allergies  Allergen Reactions  . Aleve [Naproxen] Swelling    Medications: Prior to Admission medications   Not on File    Physical Exam Vitals: Blood pressure 100/60, pulse 69, height 5\' 4"  (1.626 m), weight 140 lb (63.5 kg), last menstrual period 01/18/2018.  General: NAD HEENT: normocephalic, anicteric Pulmonary: CATB, no increased work of breathing Cardiovascular: RRR Abdomen: NABS, soft, non-tender, non-distended.  Umbilicus without lesions.  No hepatomegaly, splenomegaly or masses palpable. No evidence of hernia  Genitourinary:  External: Normal external female genitalia.  Normal urethral meatus, normal  Bartholin's and Skene's glands.    Vagina: Normal vaginal mucosa, no evidence of prolapse.    Cervix: small nabothian cyst vs calcification noted at 11 O'Clock with surrounding hyperemia, no bleeding  Uterus: Non-enlarged, mobile, normal contour.  No CMT  Adnexa: ovaries non-enlarged, no adnexal  masses  Rectal: deferred  Lymphatic: no evidence of inguinal lymphadenopathy Extremities: no edema, erythema, or tenderness Neurologic: Grossly intact Psychiatric: mood appropriate, affect full  Female chaperone present for pelvic portion of the physical exam  GYNECOLOGY CLINIC COLPOSCOPY PROCEDURE NOTE  52 y.o. G3P3 for focal erythematous lesion and what appears to be a small nabothian cyst at 11 O'Clock from the cervical os.  Is the patient  pregnant: No LMP: Patient's last menstrual period was 01/18/2018. Smoking status:  reports that she quit smoking about 25 years ago. She quit after 7.00 years of use. She has never used smokeless tobacco. Contraception: vasectomy History of STD:  No Future fertility desired:  No  Patient given verbal consent. The patient was position in dorsal lithotomy position. Speculum was placed the cervix was visualized.   The cervix was prepped with betadine  Small nabothian cyst with surrounding hyperemia and inflammation at 11 O'Clock; corresponding biopsies obtained.   ECC specimen obtained:  No  All specimens were labeled and sent to pathology.   Patient was given post procedure instructions.  Will follow up pathology and manage accordingly.  Assessment: 52 y.o. G3P3 with abdominal/pelvic pain irregular menses  Problem List Items Addressed This Visit      Other   Cervical lesion - Primary   Relevant Orders   Pathology   Pelvic pain   Relevant Orders   US PELVIS TRANSVANGINAL NON-OB (TV ONLY)    Other Visit Diagnoses    Abnormal perimenopausal bleeding           1) We discussed the possible etiologies for pelvic pain in women.  Gynecologic causes may include endometriosis, adenomyosis, pelvic inflammatory disease (PID), ovarian cysts, ovarian or tubal torsion, and in rare case gynecologic malignancy such as cervical, uterine, or ovarian cancer.  In addition thee possibility of non-gynecologic etiologies such as urinary or GI tract pathology  or disordered, as well as musculoskeletal problems.  The goal is to complete a basic work up in hopes of identifying the underlying cause which in turn will dictate treatment.  In the meantime supportive measures such as localized heat, and NSAIDs are reasonable first steps.     - Prescription drug database was not reviewed, UDS was not ordered - Transvaginal ultrasound ordered - Blood work obtained today No  - Cervical cultures No  - Average age of menospause 34 in the use but significant variation may be noted.  If no focal lesion and no intermenstrual spotting may follow expectantly and obtain FSH/Estradiol levels at next visit. - If thickened endometrium proceed with endometrial biopsy  2) Return in about 1 week (around 02/17/2018) for GYN [place] and follow up.   Vena Austria, MD, [facility], [facility] 02/10/2018, 1:38 PM

## 2018-02-12 LAB — PATHOLOGY

## 2018-02-16 ENCOUNTER — Inpatient Hospital Stay
Admission: RE | Admit: 2018-02-16 | Discharge: 2018-02-16 | Disposition: A | Discharge status: Home / Self Care | Payer: Self-pay | Source: Ambulatory Visit | Attending: *Deleted | Admitting: *Deleted

## 2018-02-16 ENCOUNTER — Other Ambulatory Visit: Payer: Self-pay | Admitting: *Deleted

## 2018-02-16 DIAGNOSIS — Z9289 Personal history of other medical treatment: Secondary | ICD-10-CM

## 2018-02-17 ENCOUNTER — Other Ambulatory Visit: Payer: Self-pay | Admitting: Internal Medicine

## 2018-02-17 DIAGNOSIS — R928 Other abnormal and inconclusive findings on diagnostic imaging of breast: Secondary | ICD-10-CM

## 2018-02-17 DIAGNOSIS — R921 Mammographic calcification found on diagnostic imaging of breast: Secondary | ICD-10-CM

## 2018-02-18 ENCOUNTER — Ambulatory Visit (INDEPENDENT_AMBULATORY_CARE_PROVIDER_SITE_OTHER): Payer: BC Managed Care – PPO

## 2018-02-18 ENCOUNTER — Encounter: Payer: Self-pay | Admitting: Obstetrics and Gynecology

## 2018-02-18 ENCOUNTER — Ambulatory Visit (INDEPENDENT_AMBULATORY_CARE_PROVIDER_SITE_OTHER): Payer: BC Managed Care – PPO | Admitting: Obstetrics and Gynecology

## 2018-02-18 VITALS — BP 108/62 | Wt 137.0 lb

## 2018-02-18 DIAGNOSIS — N924 Excessive bleeding in the premenopausal period: Secondary | ICD-10-CM

## 2018-02-18 DIAGNOSIS — R102 Pelvic and perineal pain: Secondary | ICD-10-CM | POA: Diagnosis not present

## 2018-02-18 NOTE — Progress Notes (Signed)
Gynecology Ultrasound Follow Up  Chief Complaint:  Chief Complaint  Patient presents with  . Follow-up    GYN U/S     History of Present Illness: Patient is a 52 y.o. female who presents today for ultrasound evaluation of abnormal uterine bleeding.  Ultrasound demonstrates the following findgins Adnexa: Normal appearing adnexa Uterus: Non-enlarged with endometrial stripe 4.655mm Additional: no free fluid  Review of Systems: Review of Systems  Constitutional: Negative.   Gastrointestinal: Negative.     Past Medical History:  Past Medical History:  Diagnosis Date  . Allergy   . Anxiety   . Arthritis   . Chicken pox   . Depression   . Gastric ulcer    2/2 NSAIDS  . GERD (gastroesophageal reflux disease)   . Headache   . HPV in female   . Hyperlipidemia   . Kidney stone   . Migraine     Past Surgical History:  Past Surgical History:  Procedure Laterality Date  . COLONOSCOPY WITH PROPOFOL N/A 02/14/2015   Procedure: COLONOSCOPY WITH PROPOFOL;  Surgeon: Earline MayotteJeffrey W Byrnett, MD;  [facility];  Service: Endoscopy;  Laterality: N/A;  . HERNIA REPAIR Left   . TONSILLECTOMY     1985  . UPPER GI ENDOSCOPY      Gynecologic History:  Patient's last menstrual period was 01/18/2018.  Family History:  Family History  Problem Relation Age of Onset  . Arthritis Mother   . Hearing loss Mother   . Hypertension Mother   . Arthritis Father   . Hearing loss Father   . Hyperlipidemia Father   . Hypertension Father   . Arthritis Maternal Grandmother   . Alcohol abuse Maternal Grandfather   . Arthritis Maternal Grandfather   . Kidney disease Maternal Grandfather   . Arthritis Paternal Grandmother   . Cancer Paternal Grandmother        breast  . Arthritis Paternal Grandfather   . Heart disease Paternal Grandfather   . Hyperlipidemia Paternal Grandfather   . Hypertension Paternal Grandfather     Social History:  Social History   Socioeconomic History    . Marital status: Married    Spouse name: Not on file  . Number of children: Not on file  . Years of education: Not on file  . Highest education level: Not on file  Occupational History  . Not on file  Social Needs  . Financial resource strain: Not on file  . Food insecurity:    Worry: Not on file    Inability: Not on file  . Transportation needs:    Medical: Not on file    Non-medical: Not on file  Tobacco Use  . Smoking status: Former Smoker    Years: 7.00    Last attempt to quit: 08/26/1992    Years since quitting: 25.5  . Smokeless tobacco: Never Used  Substance and Sexual Activity  . Alcohol use: Yes    Alcohol/week: 0.0 oz    Comment: occasionally  . Drug use: No  . Sexual activity: Yes    Birth control/protection: None  Lifestyle  . Physical activity:    Days per week: Not on file    Minutes per session: Not on file  . Stress: Not on file  Relationships  . Social connections:    Talks on phone: Not on file    Gets together: Not on file    Attends religious service: Not on file    Active member of club or organization:  Not on file    Attends meetings of clubs or organizations: Not on file    Relationship status: Not on file  . Intimate partner violence:    Fear of current or ex partner: Not on file    Emotionally abused: Not on file    Physically abused: Not on file    Forced sexual activity: Not on file  Other Topics Concern  . Not on file  Social History Narrative   Married    3 kids    Teacher-Library    Masters degree   Feels safe in relationship, wears seat belt, owns guns     Allergies:  Allergies  Allergen Reactions  . Aleve [Naproxen] Swelling    Medications: Prior to Admission medications   Not on File    Physical Exam Vitals: Blood pressure 108/62, weight 137 lb (62.1 kg), last menstrual period 01/18/2018.  General: NAD HEENT: normocephalic, anicteric Pulmonary: No increased work of breathing Extremities: no edema, erythema, or  tenderness Neurologic: Grossly intact, normal gait Psychiatric: mood appropriate, affect full  Mm Screening Breast Tomo Bilateral  Result Date: 02/17/2018 CLINICAL DATA:  Screening. EXAM: DIGITAL SCREENING BILATERAL MAMMOGRAM WITH TOMO AND CAD COMPARISON:  Previous exam(s). ACR Breast Density Category c: The breast tissue is heterogeneously dense, which may obscure small masses. FINDINGS: In the left breast, calcifications warrant further evaluation. In the right breast, no findings suspicious for malignancy. Images were processed with CAD. IMPRESSION: Further evaluation is suggested for calcifications in the left breast. RECOMMENDATION: Diagnostic mammogram of the left breast. (Code:[id number]) The patient will be contacted regarding the findings, and additional imaging will be scheduled. BI-RADS CATEGORY  0: Incomplete. Need additional imaging evaluation and/or prior mammograms for comparison. Electronically Signed   By: Edwin Cap M.D.   On: 02/17/2018 10:09   Mm Outside Films Mammo  Result Date: 02/16/2018 This examination belongs to an outside facility and is stored here for comparison purposes only.  Contact the originating outside institution for any associated report or interpretation.        Assessment: 52 y.o. G3P3 presenting for follow up of abnormal uterine bleeding  Plan: Problem List Items Addressed This Visit    None    Visit Diagnoses    Abnormal perimenopausal bleeding    -  Primary   Relevant Orders   Follicle stimulating hormone (Completed)   Estradiol (Completed)      1) Normal gynecologic ultrasound today without evidence of focal lesion or endometrial thickening.  We discussed that in a pre-menopausal and peri-menopausal woman no upper limit has been set for endometrial thickness.  There is no intermenstrual spotting which would warrant an endometrial biopsy. - patient elects for expectant management - FSH/Estradiol today  2) Mammogram - going for  diagnostic follow up  3) A total of 15 minutes were spent in face-to-face contact with the patient during this encounter with over half of that time devoted to counseling and coordination of care.  4) Return in about 1 year (around 02/19/2019) for annual/repeat pap.    Vena Austria, MD, [facility], [facility] 02/18/2018, 2:26 PM

## 2018-02-19 LAB — FOLLICLE STIMULATING HORMONE: FSH: 5.2 m[IU]/mL

## 2018-02-19 LAB — ESTRADIOL: Estradiol: 68.7 pg/mL

## 2018-03-02 ENCOUNTER — Ambulatory Visit
Admission: RE | Admit: 2018-03-02 | Discharge: 2018-03-02 | Disposition: A | Discharge status: Home / Self Care | Payer: BC Managed Care – PPO | Source: Ambulatory Visit | Attending: Internal Medicine | Admitting: Internal Medicine

## 2018-03-02 DIAGNOSIS — R928 Other abnormal and inconclusive findings on diagnostic imaging of breast: Secondary | ICD-10-CM | POA: Diagnosis not present

## 2018-03-02 DIAGNOSIS — R921 Mammographic calcification found on diagnostic imaging of breast: Secondary | ICD-10-CM | POA: Diagnosis present

## 2018-03-03 ENCOUNTER — Other Ambulatory Visit: Payer: Self-pay | Admitting: Internal Medicine

## 2018-03-03 DIAGNOSIS — R921 Mammographic calcification found on diagnostic imaging of breast: Secondary | ICD-10-CM

## 2018-04-28 ENCOUNTER — Encounter: Payer: Self-pay | Admitting: Internal Medicine

## 2018-05-07 ENCOUNTER — Other Ambulatory Visit: Payer: Self-pay | Admitting: Internal Medicine

## 2018-05-07 DIAGNOSIS — R921 Mammographic calcification found on diagnostic imaging of breast: Secondary | ICD-10-CM

## 2018-06-29 ENCOUNTER — Other Ambulatory Visit: Payer: BC Managed Care – PPO

## 2018-07-03 ENCOUNTER — Other Ambulatory Visit: Payer: BC Managed Care – PPO

## 2018-07-07 ENCOUNTER — Ambulatory Visit: Payer: BC Managed Care – PPO | Admitting: Internal Medicine

## 2018-07-07 ENCOUNTER — Encounter: Payer: Self-pay | Admitting: Internal Medicine

## 2018-07-07 VITALS — BP 112/70 | HR 71 | Temp 98.3°F | Ht 64.0 in | Wt 136.4 lb

## 2018-07-07 DIAGNOSIS — R41840 Attention and concentration deficit: Secondary | ICD-10-CM

## 2018-07-07 DIAGNOSIS — N870 Mild cervical dysplasia: Secondary | ICD-10-CM | POA: Diagnosis not present

## 2018-07-07 DIAGNOSIS — F419 Anxiety disorder, unspecified: Secondary | ICD-10-CM

## 2018-07-07 DIAGNOSIS — L570 Actinic keratosis: Secondary | ICD-10-CM

## 2018-07-07 NOTE — Progress Notes (Signed)
Pre visit review using our clinic review tool, if applicable. No additional management support is needed unless otherwise documented below in the visit note. 

## 2018-07-07 NOTE — Patient Instructions (Addendum)
Dr. Olga MillersAlison Bray psychology to work up for anxiety vs ADD/ADHD -[place]   Check school on date Tdap with the school  -dirty cut and whooping cough  Consider hepatitis B vaccine- new vaccine that's  2 doses 1 month apart    Supporting Someone With Attention Deficit Hyperactivity Disorder Attention deficit hyperactivity disorder (ADHD) is a behavior problem that is present in a person due to the way that his or her brain functions (neurobehavioral disorder). It is a common cause of behavioral and learning (academic) problems among children. ADHD is a long-term (chronic) condition. If this disorder is not treated, it can have serious effects into adolescence and adulthood. When a person has ADHD, his or her condition can affect others around him or her, such as friends and family members. Friends and family can help by offering support and understanding. What do I need to know about this condition? ADHD can affect daily functioning in ways that often cause problems for the person with ADHD and his or her friends and family members. A child with ADHD may:  Have a poor attention span. This means that he or she can only stay focused or interested in something for a short time.  Get distracted easily.  Have trouble listening to instructions.  Daydream.  Make careless mistakes.  Be forgetful.  Talk too much, such as blurting out answers to questions.  Have trouble sitting still for long.  Fidget or get out of his or her seat during class.  An adult with ADHD may:  Get distracted easily.  Be disorganized at home and work.  Miss, forget, or be late for appointments.  Have trouble with details.  Have trouble completing tasks.  Be irritable and impatient.  Get bored easily during meetings.  Have great difficulty concentrating.  What do I need to know about the treatment options? Treatment for this condition usually involves:  Behavioral treatment. Working with a Paramedictherapist,  the person with ADHD may: ? Set rewards for desired behavior. ? Set small goals and clear expectations, and be held accountable for meeting them. ? Get help with planning and timing activities. ? Become more patient and more mindful of the condition.  Medicines, such as: ? Stimulant medicines that help a person to:  Control his or her behavior (decrease impulsivity).  Control his or her extra physical activity (decrease hyperactivity).  Increase his or her ability to pay attention. ? Antidepressants. ? Certain blood pressure medicines.  Structured classroom management for children at school, such as tutoring or extra support in classes.  Techniques for parents to use at home to help manage their child's symptoms and behavior. These include rewarding good behavior, providing consistent discipline, and setting limits.  How can I support my loved one? Talk about the condition  Pick a time to talk with your loved one when distractions and interruptions are unlikely.  Let your loved one know that he or she is capable of success. Focus on your loved one's strengths, and try to not let your loved one use ADHD as an excuse for undesirable behavior.  Let your loved one know that there are well-known, successful people who also have ADHD. This may be encouraging to your loved one.  Give your loved one time to process his or her thoughts and to ask questions.  Children with ADHD may benefit from hearing more about how their treatment plan will help them. This may help them focus on goal behaviors. Find support and resources A health care  provider may be able to recommend resources that are available online or over the phone. You could start with:  Attention Deficit Disorder Association (ADDA): HotterNames.de  [employer] [facility]): MarathonMeals.com.cy.shtml  Training classes or conferences that  help parents of children with ADHD to support their children and cope with the disorder.  Support groups for families who are affected by ADHD.  General support If you are a parent of a child with ADHD, you can take the following actions to support your child's education:  Talk to teachers about the ways that your child learns best.  Be your child's advocate and stay in touch with his or her school about all problems related to ADHD.  At the end of the summer, make appointments to talk with teachers and other school staff before the new school year begins.  Listen to teachers carefully, and share your child's history with them.  Create a behavior plan that your child, your family, and the teachers can agree on. Write down goals to help your child succeed.  How should I care for myself? It is important to find ways to care for your body, mind, and well-being while supporting someone with ADHD.  Spend time with friends and family. Find someone you can talk to who will also help you work on using coping skills to manage stress.  Understand what your limits are. Say "no" to requests or events that lead to a schedule that is too busy.  Make time for activities that help you relax, and try to not feel guilty about taking time for yourself.  Consider trying meditation and deep breathing exercises to lower your stress.  Get plenty of sleep.  Exercise, even if it is just taking a short walk a few times a week.  If you are a parent of a child with ADHD, arrange for child care so you can take breaks once in a while.  What are some signs that the condition is getting worse? Signs that your loved one's condition may be getting worse include:  Increased trouble completing tasks and paying attention.  Hyperactivity and impulsivity.  Problems with relationships.  Impatience, restlessness, and mood swings.  Worsening problems at school, if applicable.  Contact a health care provider  if:  Your loved one's symptoms get worse.  Your loved one shows signs of depression, anxiety, or another mental health condition.  Your child has behavioral problems at school. Summary  Attention deficit hyperactivity disorder (ADHD) is a long-term (chronic) condition that can affect daily functioning in ways that often cause problems for the person with ADHD and his or her loved ones.  This disorder can be treated effectively with medicine, behavioral treatment, and techniques to manage symptoms and behaviors.  Many organizations and groups are available to help families to manage ADHD.  The support people in the life of someone with ADHD play an extremely important role in helping that person develop healthy behaviors to live a satisfying life.  It is important to find ways to care for your own body, mind, and well-being while supporting someone with ADHD. Make time for activities that help you relax. This information is not intended to replace advice given to you by your health care provider. Make sure you discuss any questions you have with your health care provider. Document Released: 12/24/2016 Document Revised: 12/24/2016 Document Reviewed: 12/24/2016 Elsevier Interactive Patient Education  2018 [employer] Inc.  Living With Attention Deficit Hyperactivity Disorder If you have been diagnosed with  attention deficit hyperactivity disorder (ADHD), you may be relieved that you now know why you have felt or behaved a certain way. Still, you may feel overwhelmed about the treatment ahead. You may also wonder how to get the support you need and how to deal with the condition [place]. With treatment and support, you can live with ADHD and manage your symptoms. How to manage lifestyle changes Managing stress Stress is your body's reaction to life changes and events, both good and bad. To cope with the stress of an ADHD diagnosis, it may help to:  Learn more about ADHD.  Exercise  regularly. Even a short daily walk can lower stress levels.  Participate in training or education programs (including social skills training classes) that teach you to deal with symptoms.  Medicines Your health care provider may suggest certain medicines if he or she feels that they will help to improve your condition. Stimulant medicines are usually prescribed to treat ADHD, and therapy may also be prescribed. It is important to:  Avoid using alcohol and other substances that may prevent your medicines from working properly [facility]).  Talk with your pharmacist or health care provider about all the medicines that you take, their possible side effects, and what medicines are safe to take together.  Make it your goal to take part in all treatment decisions (shared decision-making). Ask about possible side effects of medicines that your health care provider recommends, and tell him or her how you feel about having those side effects. It is best if shared decision-making with your health care provider is part of your total treatment plan.  Relationships To strengthen your relationships with family members while treating your condition, consider taking part in family therapy. You might also attend self-help groups alone or with a loved one. Be honest about how your symptoms affect your relationships. Make an effort to communicate respectfully instead of fighting, and find ways to show others that you care. Psychotherapy may be useful in helping you cope with how ADHD affects your relationships. How to recognize changes in your condition The following signs may mean that your treatment is working well and your condition is improving:  Consistently being on time for appointments.  Being more organized at home and work.  Other people noticing improvements in your behavior.  Achieving goals that you set for yourself.  Thinking more clearly.  The following signs may mean that your treatment  is not working very well:  Feeling impatience or more confusion.  Missing, forgetting, or being late for appointments.  An increasing sense of disorganization and messiness.  More difficulty in reaching goals that you set for yourself.  Loved ones becoming angry or frustrated with you.  Where to find support Talking to others  Keep emotion out of important discussions and speak in a calm, logical way.  Listen closely and patiently to your loved ones. Try to understand their point of view, and try to avoid getting defensive.  Take responsibility for the consequences of your actions.  Ask that others do not take your behaviors personally.  Aim to solve problems as they come up, and express your feelings instead of bottling them up.  Talk openly about what you need from your loved ones and how they can support you.  Consider going to family therapy sessions or having your family meet with a specialist who deals with ADHD-related behavior problems. Finances Not all insurance plans cover mental health care, so it is important to check with your  insurance carrier. If paying for co-pays or counseling services is a problem, search for a local or county mental health care center. Public mental health care services may be offered there at a low cost or no cost when you are not able to see a private health care provider. If you are taking medicine for ADHD, you may be able to get the generic form, which may be less expensive than brand-name medicine. Some makers of prescription medicines also offer help to patients who cannot afford the medicines that they need. Follow these instructions at home:  Take over-the-counter and prescription medicines only as told by your health care provider. Check with your health care provider before taking any new medicines.  Create structure and an organized atmosphere at home. For example: ? Make a list of tasks, then rank them from most important to least  important. Work on one task at a time until your listed tasks are done. ? Make a daily schedule and follow it consistently every [place]. ? Use an appointment calendar, and check it 2 or 3 times a [place] to keep on track. Keep it with you when you leave the house. ? Create spaces where you keep certain things, and always put things back in their places after you use them.  Keep all follow-up visits as told by your health care provider. This is important. Questions to ask your health care provider:  What are the risks and benefits of taking medicines?  Would I benefit from therapy?  How often should I follow up with a health care provider? Contact a health care provider if:  You have side effects from your medicines, such as: ? Repeated muscle twitches, coughing, or speech outbursts. ? Sleep problems. ? Loss of appetite. ? Depression. ? New or worsening behavior problems. ? Dizziness. ? Unusually fast heartbeat. ? Stomach pains. ? Headaches. Get help right away if:  You have a severe reaction to a medicine.  Your behavior suddenly gets worse. Summary  With treatment and support, you can live with ADHD and manage your symptoms.  The medicines that are most often prescribed for ADHD are stimulants.  Consider taking part in family therapy or self-help groups with family members or friends.  When you talk with friends and family about your ADHD, be patient and communicate openly.  Take over-the-counter and prescription medicines only as told by your health care provider. Check with your health care provider before taking any new medicines. This information is not intended to replace advice given to you by your health care provider. Make sure you discuss any questions you have with your health care provider. Document Released: 12/12/2016 Document Revised: 12/12/2016 Document Reviewed: 12/12/2016 Elsevier Interactive Patient Education  2018 Elsevier Inc.  Generalized Anxiety Disorder,  Adult Generalized anxiety disorder (GAD) is a mental health disorder. People with this condition constantly worry about everyday events. Unlike normal anxiety, worry related to GAD is not triggered by a specific event. These worries also do not fade or get better with time. GAD interferes with life functions, including relationships, work, and school. GAD can vary from mild to severe. People with severe GAD can have intense waves of anxiety with physical symptoms (panic attacks). What are the causes? The exact cause of GAD is not known. What increases the risk? This condition is more likely to develop in:  Women.  People who have a family history of anxiety disorders.  People who are very shy.  People who experience very stressful life events,  such as the death of a loved one.  People who have a very stressful family environment.  What are the signs or symptoms? People with GAD often worry excessively about many things in their lives, such as their health and family. They may also be overly concerned about:  Doing well at work.  Being on time.  Natural disasters.  Friendships.  Physical symptoms of GAD include:  Fatigue.  Muscle tension or having muscle twitches.  Trembling or feeling shaky.  Being easily startled.  Feeling like your heart is pounding or racing.  Feeling out of breath or like you cannot take a deep breath.  Having trouble falling asleep or staying asleep.  Sweating.  Nausea, diarrhea, or irritable bowel syndrome (IBS).  Headaches.  Trouble concentrating or remembering facts.  Restlessness.  Irritability.  How is this diagnosed? Your health care provider can diagnose GAD based on your symptoms and medical history. You will also have a physical exam. The health care provider will ask specific questions about your symptoms, including how severe they are, when they started, and if they come and go. Your health care provider may ask you about your  use of alcohol or drugs, including prescription medicines. Your health care provider may refer you to a mental health specialist for further evaluation. Your health care provider will do a thorough examination and may perform additional tests to rule out other possible causes of your symptoms. To be diagnosed with GAD, a person must have anxiety that:  Is out of his or her control.  Affects several different aspects of his or her life, such as work and relationships.  Causes distress that makes him or her unable to take part in normal activities.  Includes at least three physical symptoms of GAD, such as restlessness, fatigue, trouble concentrating, irritability, muscle tension, or sleep problems.  Before your health care provider can confirm a diagnosis of GAD, these symptoms must be present more days than they are not, and they must last for six months or longer. How is this treated? The following therapies are usually used to treat GAD:  Medicine. Antidepressant medicine is usually prescribed for long-term daily control. Antianxiety medicines may be added in severe cases, especially when panic attacks occur.  Talk therapy (psychotherapy). Certain types of talk therapy can be helpful in treating GAD by providing support, education, and guidance. Options include: ? Cognitive behavioral therapy (CBT). People learn coping skills and techniques to ease their anxiety. They learn to identify unrealistic or negative thoughts and behaviors and to replace them with positive ones. ? Acceptance and commitment therapy (ACT). This treatment teaches people how to be mindful as a way to cope with unwanted thoughts and feelings. ? Biofeedback. This process trains you to manage your body's response (physiological response) through breathing techniques and relaxation methods. You will work with a therapist while machines are used to monitor your physical symptoms.  Stress management techniques. These include  yoga, meditation, and exercise.  A mental health specialist can help determine which treatment is best for you. Some people see improvement with one type of therapy. However, other people require a combination of therapies. Follow these instructions at home:  Take over-the-counter and prescription medicines only as told by your health care provider.  Try to maintain a normal routine.  Try to anticipate stressful situations and allow extra time to manage them.  Practice any stress management or self-calming techniques as taught by your health care provider.  Do not punish yourself for  setbacks or for not making progress.  Try to recognize your accomplishments, even if they are small.  Keep all follow-up visits as told by your health care provider. This is important. Contact a health care provider if:  Your symptoms do not get better.  Your symptoms get worse.  You have signs of depression, such as: ? A persistently sad, cranky, or irritable mood. ? Loss of enjoyment in activities that used to bring you joy. ? Change in weight or eating. ? Changes in sleeping habits. ? Avoiding friends or family members. ? Loss of energy for normal tasks. ? Feelings of guilt or worthlessness. Get help right away if:  You have serious thoughts about hurting yourself or others. If you ever feel like you may hurt yourself or others, or have thoughts about taking your own life, get help right away. You can go to your nearest emergency department or call:  Your local emergency services (911 in the [place]  A suicide crisis helpline, such as the National Suicide Prevention Lifeline at 5342547711. This is open 24 hours a [place].  Summary  Generalized anxiety disorder (GAD) is a mental health disorder that involves worry that is not triggered by a specific event.  People with GAD often worry excessively about many things in their lives, such as their health and family.  GAD may cause physical  symptoms such as restlessness, trouble concentrating, sleep problems, frequent sweating, nausea, diarrhea, headaches, and trembling or muscle twitching.  A mental health specialist can help determine which treatment is best for you. Some people see improvement with one type of therapy. However, other people require a combination of therapies. This information is not intended to replace advice given to you by your health care provider. Make sure you discuss any questions you have with your health care provider. Document Released: 12/07/2012 Document Revised: 07/02/2016 Document Reviewed: 07/02/2016 Elsevier Interactive Patient Education  [employer].

## 2018-07-07 NOTE — Progress Notes (Signed)
Chief Complaint  Patient presents with  . Follow-up   F/u 1. C/o trouble with focus/attention at home and at work. 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 She has a h/o anxiety but does not want to take antidepressants as of yet had taken prozac in the past and a medication which she beliefs started with and "s" in the 1990s. Due to attention issues she is having trouble completing tasks at home and work, daughter has anxiety and son adhd  2. LSIL/CIN 1 + noted ectocervix bx Dr. Chauncey Cruel 02/10/18 and due to f/u 07/2018    Review of Systems  Constitutional: Negative for weight loss.  HENT: Negative for hearing loss.   Eyes: Negative for blurred vision.  Respiratory: Negative for shortness of breath.   Cardiovascular: Negative for chest pain.  Skin: Negative for rash.  Psychiatric/Behavioral: The patient is nervous/anxious.        +attention issues     Past Medical History:  Diagnosis Date  . Allergy   . Anxiety   . Arthritis   . Chicken pox   . Depression   . Gastric ulcer    2/2 NSAIDS  . GERD (gastroesophageal reflux disease)   . Headache   . HPV in female   . Hyperlipidemia   . Kidney stone   . Migraine    Past Surgical History:  Procedure Laterality Date  . COLONOSCOPY WITH PROPOFOL N/A 02/14/2015   Procedure: COLONOSCOPY WITH PROPOFOL;  Surgeon: Earline Mayotte, MD;  Location: [facility] ENDOSCOPY;  Service: Endoscopy;  Laterality: N/A;  . HERNIA REPAIR Left   . TONSILLECTOMY     1985  . UPPER GI ENDOSCOPY     Family History  Problem Relation Age of Onset  . Arthritis Mother   . Hearing loss Mother   . Hypertension Mother   . Arthritis Father   . Hearing loss Father   . Hyperlipidemia Father   . Hypertension Father   . Arthritis Maternal Grandmother   . Alcohol abuse Maternal Grandfather   . Arthritis Maternal Grandfather   . Kidney disease Maternal Grandfather   .  Arthritis Paternal Grandmother   . Cancer Paternal Grandmother        breast  . Arthritis Paternal Grandfather   . Heart disease Paternal Grandfather   . Hyperlipidemia Paternal Grandfather   . Hypertension Paternal Grandfather   . Breast cancer Neg Hx    Social History   Socioeconomic History  . Marital status: Married    Spouse name: Not on file  . Number of children: Not on file  . Years of education: Not on file  . Highest education level: Not on file  Occupational History  . Not on file  Social Needs  . Financial resource strain: Not on file  . Food insecurity:    Worry: Not on file    Inability: Not on file  . Transportation needs:    Medical: Not on file    Non-medical: Not on file  Tobacco Use  . Smoking status: Former Smoker    Years: 7.00    Last attempt to quit: 08/26/1992    Years since quitting: 25.8  . Smokeless tobacco: Never Used  Substance and Sexual Activity  . Alcohol use: Yes    Alcohol/week: 0.0 standard drinks    Comment: occasionally  . Drug use: No  . Sexual activity: Yes    Birth control/protection:  None  Lifestyle  . Physical activity:    Days per week: Not on file    Minutes per session: Not on file  . Stress: Not on file  Relationships  . Social connections:    Talks on phone: Not on file    Gets together: Not on file    Attends religious service: Not on file    Active member of club or organization: Not on file    Attends meetings of clubs or organizations: Not on file    Relationship status: Not on file  . Intimate partner violence:    Fear of current or ex partner: Not on file    Emotionally abused: Not on file    Physically abused: Not on file    Forced sexual activity: Not on file  Other Topics Concern  . Not on file  Social History Narrative   Married    3 kids    Teacher-Library    Masters degree   Feels safe in relationship, wears seat belt, owns guns    No outpatient medications have been marked as taking for the  07/07/18 encounter (Office Visit) with McLean-Scocuzza, Pasty Spillers, MD.   Allergies  Allergen Reactions  . Aleve [Naproxen] Swelling   No results found for this or any previous visit (from the past 2160 hour(s)). Objective  Body mass index is 23.41 kg/m. Wt Readings from Last 3 Encounters:  07/07/18 136 lb 6.4 oz (61.9 kg)  02/18/18 137 lb (62.1 kg)  02/10/18 140 lb (63.5 kg)   Temp Readings from Last 3 Encounters:  07/07/18 98.3 F (36.8 C) (Oral)  01/15/18 98.8 F (37.1 C) (Oral)  11/27/17 98.9 F (37.2 C) (Oral)   BP Readings from Last 3 Encounters:  07/07/18 112/70  02/18/18 108/62  02/10/18 100/60   Pulse Readings from Last 3 Encounters:  07/07/18 71  02/10/18 69  01/15/18 72    Physical Exam  Constitutional: She is oriented to person, place, and time. Vital signs are normal. She appears well-developed and well-nourished. She is cooperative.  HENT:  Head: Normocephalic and atraumatic.  Mouth/Throat: Oropharynx is clear and moist and mucous membranes are normal.  Eyes: Pupils are equal, round, and reactive to light. Conjunctivae are normal.  Cardiovascular: Normal rate, regular rhythm and normal heart sounds.  Pulmonary/Chest: Effort normal and breath sounds normal.  Neurological: She is alert and oriented to person, place, and time. Gait normal.  Skin: Skin is warm, dry and intact.  Psychiatric: She has a normal mood and affect. Her speech is normal and behavior is normal. Judgment and thought content normal. Cognition and memory are normal.  Nursing note and vitals reviewed.   Assessment   1. Attention issues r/o anxiety vs ahdh etiology  2. LSIL/CIN 1 02/10/18  3. Aks to trunk and forearms  4. HM Plan   1. Refer to Dr. Olga Millers for ADHD testing  2. F/u Ob/GYN Dr. Bonney Aid 07/2018 3. F/u dermatology 07/2018  4.  Declines flu shot  Tdap unknown when last had pt will check with school again today Disc shingrix at f/u  Hep B rec given info to read about  prev disc and rec again today  Colonoscopy per pt had 02/14/15 repeat due in 10 years Former smoker quit 1990 smoked 5-7 years max 0.5 ppd  Pap 01/15/18 neg pap neg HPV - OB/GYN Dr. Bonney Aid h/o abnormal pap and LN2 cervix in HS -LSIL/CIN 1 noted on ectocervix bx 02/10/18 due to f/u in 07/2018  03/02/18 left breast calcifications sch 08/2018 rec healthy diet choices and exercise  Aks to arms and chest due to see derm 2nd time 07/2018  Provider: Dr. French Ana McLean-Scocuzza-Internal Medicine

## 2018-07-20 ENCOUNTER — Ambulatory Visit: Payer: BC Managed Care – PPO | Admitting: Internal Medicine

## 2018-07-27 ENCOUNTER — Telehealth: Payer: Self-pay

## 2018-07-27 NOTE — Telephone Encounter (Signed)
Check on referral and inform pt   Thanks MTS

## 2018-07-27 NOTE — Telephone Encounter (Signed)
Copied from CRM [id number]. Topic: Referral - Status >> Jul 24, 2018 12:03 PM Mickel BaasMcGee, Demi B, [place] wrote: Reason for CRM: Patient would like to know the status of the psychology referral. States that she mentioned it at her 07/07/18 visit and has not heard anything back regarding this. Please advise. CB#: [phone]

## 2018-08-04 NOTE — Telephone Encounter (Signed)
Pt stated that Dr. Forbes CellarAllison Bray, the psychologist Dr. French Anaracy referred pt to cannot see pt until April 2020. Pt did schedule appt however she would like to know what to do in the interim. Please advise. CB# [phone]

## 2018-08-04 NOTE — Telephone Encounter (Signed)
Please advise 

## 2018-08-04 NOTE — Telephone Encounter (Signed)
She can call them back daily for cancellations or we can try to get her in with someone else   TMS

## 2018-09-03 ENCOUNTER — Other Ambulatory Visit: Payer: Self-pay | Admitting: Internal Medicine

## 2018-09-03 ENCOUNTER — Ambulatory Visit
Admission: RE | Admit: 2018-09-03 | Discharge: 2018-09-03 | Disposition: A | Discharge status: Home / Self Care | Payer: BC Managed Care – PPO | Source: Ambulatory Visit | Attending: Internal Medicine | Admitting: Internal Medicine

## 2018-09-03 DIAGNOSIS — R921 Mammographic calcification found on diagnostic imaging of breast: Secondary | ICD-10-CM | POA: Diagnosis present

## 2018-11-23 ENCOUNTER — Ambulatory Visit: Payer: BC Managed Care – PPO | Admitting: Psychology

## 2018-12-08 ENCOUNTER — Ambulatory Visit: Payer: BC Managed Care – PPO | Admitting: Psychology

## 2019-01-05 ENCOUNTER — Other Ambulatory Visit: Payer: Self-pay

## 2019-01-05 ENCOUNTER — Ambulatory Visit: Payer: BC Managed Care – PPO | Admitting: Internal Medicine

## 2019-01-05 ENCOUNTER — Ambulatory Visit (INDEPENDENT_AMBULATORY_CARE_PROVIDER_SITE_OTHER): Payer: BC Managed Care – PPO | Admitting: Internal Medicine

## 2019-01-05 ENCOUNTER — Telehealth: Payer: Self-pay

## 2019-01-05 ENCOUNTER — Encounter: Payer: Self-pay | Admitting: Internal Medicine

## 2019-01-05 DIAGNOSIS — Z0000 Encounter for general adult medical examination without abnormal findings: Secondary | ICD-10-CM

## 2019-01-05 DIAGNOSIS — E785 Hyperlipidemia, unspecified: Secondary | ICD-10-CM | POA: Diagnosis not present

## 2019-01-05 DIAGNOSIS — L989 Disorder of the skin and subcutaneous tissue, unspecified: Secondary | ICD-10-CM | POA: Diagnosis not present

## 2019-01-05 DIAGNOSIS — R921 Mammographic calcification found on diagnostic imaging of breast: Secondary | ICD-10-CM

## 2019-01-05 DIAGNOSIS — Z1389 Encounter for screening for other disorder: Secondary | ICD-10-CM

## 2019-01-05 DIAGNOSIS — Z1329 Encounter for screening for other suspected endocrine disorder: Secondary | ICD-10-CM

## 2019-01-05 DIAGNOSIS — F909 Attention-deficit hyperactivity disorder, unspecified type: Secondary | ICD-10-CM

## 2019-01-05 DIAGNOSIS — E559 Vitamin D deficiency, unspecified: Secondary | ICD-10-CM

## 2019-01-05 MED ORDER — AMPHETAMINE-DEXTROAMPHETAMINE 5 MG PO TABS: 5.0000 mg | ORAL_TABLET | Freq: Two times a day (BID) | ORAL | 0 refills | Status: DC

## 2019-01-05 NOTE — Telephone Encounter (Signed)
Copied from CRM [id number]. Topic: General - Other >> Jan 04, 2019  4:30 PM Marylen Ponto wrote: Reason for CRM: Pt returned call to the office. Pt requests call back.

## 2019-01-05 NOTE — Progress Notes (Signed)
Virtual Visit via Video Note  I connected with Brooke Chambers  on 01/05/19 at  1:40 PM EDT by a video enabled telemedicine application and verified that I am speaking with the correct person using two identifiers.  Location patient: home Location provider:work  Persons participating in the virtual visit: patient, provider  I discussed the limitations of evaluation and management by telemedicine and the availability of in person appointments. The patient expressed understanding and agreed to proceed.   HPI: 1. C/o skin lesions left ear and right forearm dermatology appt cancelled will try to resch  2. C/w ADHD needs psychological testing appt  11/13/2018 was cancelled 2/2 COVID 19 will work on trying to reschedule appt for pt will do trial of adderral 5 mg bid in meantime as pt has had trouble with attn, focus and memory x months and organizational skills  3. Left breast Ca2+ needs repeat mammogram 03/04/2019 b/l diag mammogram order in   ROS: See pertinent positives and negatives per HPI.  Past Medical History:  Diagnosis Date  . 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 Bonney Aid   . Allergy   . Anxiety   . Arthritis   . Chicken pox   . Depression   . Gastric ulcer    2/2 NSAIDS  . GERD (gastroesophageal reflux disease)   . Headache   . HPV in female   . Hyperlipidemia   . Kidney stone   . Migraine     Past Surgical History:  Procedure Laterality Date  . COLONOSCOPY WITH PROPOFOL N/A 02/14/2015   Procedure: COLONOSCOPY WITH PROPOFOL;  Surgeon: Earline Mayotte, MD;  Location: [facility] ENDOSCOPY;  Service: Endoscopy;  Laterality: N/A;  . ectocervix biopsy     01/2018 LSIL/CIN 1 Dr. Bonney Aid   . HERNIA REPAIR Left   . TONSILLECTOMY     1985  . UPPER GI ENDOSCOPY      Family History  Problem Relation Age of Onset  . Arthritis Mother   . Hearing loss Mother   . Hypertension Mother   . Arthritis Father   . Hearing loss Father   . Hyperlipidemia Father   . Hypertension  Father   . Arthritis Maternal Grandmother   . Alcohol abuse Maternal Grandfather   . Arthritis Maternal Grandfather   . Kidney disease Maternal Grandfather   . Arthritis Paternal Grandmother   . Cancer Paternal Grandmother        breast  . Arthritis Paternal Grandfather   . Heart disease Paternal Grandfather   . Hyperlipidemia Paternal Grandfather   . Hypertension Paternal Grandfather   . Breast cancer Neg Hx     SOCIAL HX: [employer] teacher    Current Outpatient Medications:  .  amphetamine-dextroamphetamine (ADDERALL) 5 MG tablet, Take 1 tablet (5 mg total) by mouth 2 (two) times daily with a meal. 2nd dose before 2 pm, Disp: 60 tablet, Rfl: 0  EXAM:  VITALS per patient if applicable:  GENERAL: alert, oriented, appears well and in no acute distress  HEENT: atraumatic, conjunttiva clear, no obvious abnormalities on inspection of external nose and ears  NECK: normal movements of the head and neck  LUNGS: on inspection no signs of respiratory distress, breathing rate appears normal, no obvious gross SOB, gasping or wheezing  CV: no obvious cyanosis  MS: moves all visible extremities without noticeable abnormality  PSYCH/NEURO: pleasant and cooperative, no obvious depression or anxiety, speech and thought processing grossly intact  ASSESSMENT AND PLAN:  Discussed the following  assessment and plan:  Attention deficit hyperactivity disorder (ADHD), unspecified ADHD type - Plan: amphetamine-dextroamphetamine (ADDERALL) 5 MG tablet bid 2nd dose before 2 pm, Ambulatory referral to Psychology try to resch appt   Skin lesion - Plan: Ambulatory referral to Dermatology Dr. Roseanne KaufmanIsenstein  Breast calcification, left dx b/l mammogram due 03/04/2019 order in   HM sch fasting labs 02/2019  Declines flu shot  Tdap unknown  Disc shingrix at f/u  Hep Brec given info to read about prev disc   Colonoscopy per pt had 02/14/15 repeat due in 10 years2026 Former smoker quit 1990 smoked 5-7 years  max 0.5 ppd Pap 01/15/18 neg pap neg HPV - OB/GYN Dr. Bonney AidStaebler h/o abnormal pap and LN2 cervix in HS -LSIL/CIN 1 noted on ectocervix bx 02/10/18 due to f/u in 07/2018   03/02/18 left breast calcifications sch dx mammogram 02/2019 b/l f/u  rec healthy diet choices and exercise  Derm referral placed see above     I discussed the assessment and treatment plan with the patient. The patient was provided an opportunity to ask questions and all were answered. The patient agreed with the plan and demonstrated an understanding of the instructions.   The patient was advised to call back or seek an in-person evaluation if the symptoms worsen or if the condition fails to improve as anticipated.  Time spent 15 minutes  Bevelyn Bucklesracy N McLean-Scocuzza, MD

## 2019-01-27 ENCOUNTER — Other Ambulatory Visit: Payer: Self-pay | Admitting: Internal Medicine

## 2019-01-27 ENCOUNTER — Telehealth: Payer: Self-pay | Admitting: *Deleted

## 2019-01-27 DIAGNOSIS — F909 Attention-deficit hyperactivity disorder, unspecified type: Secondary | ICD-10-CM

## 2019-01-27 MED ORDER — AMPHETAMINE-DEXTROAMPHETAMINE 5 MG PO TABS
5.0000 mg | ORAL_TABLET | Freq: Every day | ORAL | 0 refills | Status: DC
Start: 2019-01-27 — End: 2019-05-11

## 2019-01-27 NOTE — Telephone Encounter (Signed)
Copied from CRM [id number]. Topic: General - Other >> Jan 27, 2019 10:22 AM Marylen Ponto wrote: Reason for CRM: Pt stated she has not started the  amphetamine-dextroamphetamine (ADDERALL) 5 MG tablet because the pharmacy advised her that her insurance is not covering it. Pt stated her pharmacy told her that it is not being covered due to the dose. Pt requests call back. Cb# [phone]

## 2019-01-27 NOTE — Telephone Encounter (Signed)
FYI

## 2019-01-27 NOTE — Telephone Encounter (Signed)
Insurance will only cover Adderral 5 mg qd not bid if bid dosing needed needs PA  Spoke to pharmacist   [employer]

## 2019-01-27 NOTE — Telephone Encounter (Signed)
Pharmacy just faxed to notify Dr. Shirlee Latch that the medication is still requiring PA although changed and  written for 1 time per Ruda. Wanted to confirmed receipt.

## 2019-01-27 NOTE — Telephone Encounter (Signed)
Inform now pharmacy/insurance is still requiring we do a PA  Call home #

## 2019-01-27 NOTE — Telephone Encounter (Signed)
Patient has been notified

## 2019-02-16 ENCOUNTER — Ambulatory Visit (INDEPENDENT_AMBULATORY_CARE_PROVIDER_SITE_OTHER): Payer: BC Managed Care – PPO | Admitting: Psychology

## 2019-02-16 DIAGNOSIS — F3341 Major depressive disorder, recurrent, in partial remission: Secondary | ICD-10-CM | POA: Diagnosis not present

## 2019-02-16 DIAGNOSIS — F909 Attention-deficit hyperactivity disorder, unspecified type: Secondary | ICD-10-CM

## 2019-03-04 ENCOUNTER — Other Ambulatory Visit: Payer: Self-pay

## 2019-03-04 ENCOUNTER — Other Ambulatory Visit (INDEPENDENT_AMBULATORY_CARE_PROVIDER_SITE_OTHER): Payer: BC Managed Care – PPO

## 2019-03-04 DIAGNOSIS — E559 Vitamin D deficiency, unspecified: Secondary | ICD-10-CM

## 2019-03-04 DIAGNOSIS — Z0000 Encounter for general adult medical examination without abnormal findings: Secondary | ICD-10-CM | POA: Diagnosis not present

## 2019-03-04 DIAGNOSIS — Z1329 Encounter for screening for other suspected endocrine disorder: Secondary | ICD-10-CM | POA: Diagnosis not present

## 2019-03-04 DIAGNOSIS — E785 Hyperlipidemia, unspecified: Secondary | ICD-10-CM

## 2019-03-04 DIAGNOSIS — Z1389 Encounter for screening for other disorder: Secondary | ICD-10-CM

## 2019-03-04 LAB — COMPREHENSIVE METABOLIC PANEL
ALT: 16 U/L (ref 0–35)
AST: 16 U/L (ref 0–37)
Albumin: 4.6 g/dL (ref 3.5–5.2)
Alkaline Phosphatase: 85 U/L (ref 39–117)
BUN: 20 mg/dL (ref 6–23)
CO2: 26 mEq/L (ref 19–32)
Calcium: 9.1 mg/dL (ref 8.4–10.5)
Chloride: 105 mEq/L (ref 96–112)
Creatinine, Ser: 0.93 mg/dL (ref 0.40–1.20)
GFR: 62.57 mL/min (ref >60.00)
Glucose, Bld: 90 mg/dL (ref 70–99)
Potassium: 4 mEq/L (ref 3.5–5.1)
Sodium: 139 mEq/L (ref 135–145)
Total Bilirubin: 0.4 mg/dL (ref 0.2–1.2)
Total Protein: 6.9 g/dL (ref 6.0–8.3)

## 2019-03-04 LAB — CBC WITH DIFFERENTIAL/PLATELET
Basophils Absolute: 0 10*3/uL (ref 0.0–0.1)
Basophils Relative: 0.5 % (ref 0.0–3.0)
Eosinophils Absolute: 0.1 10*3/uL (ref 0.0–0.7)
Eosinophils Relative: 1.9 % (ref 0.0–5.0)
HCT: 36.1 % (ref 36.0–46.0)
Hemoglobin: 12.2 g/dL (ref 12.0–15.0)
Lymphocytes Relative: 26.7 % (ref 12.0–46.0)
Lymphs Abs: 1.5 10*3/uL (ref 0.7–4.0)
MCHC: 34 g/dL (ref 30.0–36.0)
MCV: 87.4 fl (ref 78.0–100.0)
Monocytes Absolute: 0.3 10*3/uL (ref 0.1–1.0)
Monocytes Relative: 6.3 % (ref 3.0–12.0)
Neutro Abs: 3.5 10*3/uL (ref 1.4–7.7)
Neutrophils Relative %: 64.6 % (ref 43.0–77.0)
Platelets: 209 10*3/uL (ref 150.0–400.0)
RBC: 4.13 Mil/uL (ref 3.87–5.11)
RDW: 12.1 % (ref 11.5–15.5)
WBC: 5.4 10*3/uL (ref 4.0–10.5)

## 2019-03-04 LAB — TSH: TSH: 2.38 u[IU]/mL (ref 0.35–4.50)

## 2019-03-04 LAB — LIPID PANEL
Cholesterol: 208 mg/dL — ABNORMAL HIGH (ref 0–200)
HDL: 61 mg/dL (ref >39.00)
LDL Cholesterol: 131 mg/dL — ABNORMAL HIGH (ref 0–99)
NonHDL: 147.45
Total CHOL/HDL Ratio: 3
Triglycerides: 81 mg/dL (ref 0.0–149.0)
VLDL: 16.2 mg/dL (ref 0.0–40.0)

## 2019-03-04 LAB — VITAMIN D 25 HYDROXY (VIT D DEFICIENCY, FRACTURES): VITD: 31.01 ng/mL (ref 30.00–100.00)

## 2019-03-05 LAB — URINALYSIS, ROUTINE W REFLEX MICROSCOPIC
Bilirubin Urine: NEGATIVE
Glucose, UA: NEGATIVE
Hgb urine dipstick: NEGATIVE
Ketones, ur: NEGATIVE
Leukocytes,Ua: NEGATIVE
Nitrite: NEGATIVE
Protein, ur: NEGATIVE
Specific Gravity, Urine: 1.023 (ref 1.001–1.03)
pH: 6.5 (ref 5.0–8.0)

## 2019-03-11 ENCOUNTER — Ambulatory Visit (INDEPENDENT_AMBULATORY_CARE_PROVIDER_SITE_OTHER): Payer: BC Managed Care – PPO | Admitting: Psychology

## 2019-03-11 DIAGNOSIS — F341 Dysthymic disorder: Secondary | ICD-10-CM

## 2019-03-11 DIAGNOSIS — F909 Attention-deficit hyperactivity disorder, unspecified type: Secondary | ICD-10-CM | POA: Diagnosis not present

## 2019-03-25 ENCOUNTER — Ambulatory Visit (INDEPENDENT_AMBULATORY_CARE_PROVIDER_SITE_OTHER): Payer: BC Managed Care – PPO | Admitting: Psychology

## 2019-03-25 DIAGNOSIS — F341 Dysthymic disorder: Secondary | ICD-10-CM | POA: Diagnosis not present

## 2019-04-16 ENCOUNTER — Encounter: Payer: Self-pay | Admitting: Internal Medicine

## 2019-04-16 ENCOUNTER — Telehealth: Payer: Self-pay | Admitting: Internal Medicine

## 2019-04-16 DIAGNOSIS — F329 Major depressive disorder, single episode, unspecified: Secondary | ICD-10-CM | POA: Insufficient documentation

## 2019-04-16 DIAGNOSIS — F419 Anxiety disorder, unspecified: Secondary | ICD-10-CM | POA: Insufficient documentation

## 2019-04-16 NOTE — Telephone Encounter (Signed)
ADHD testing 03/24/19 Dr. Glennon Hamilton negative ADHD and + depression  -does she want to try medication to treat this?   [place]

## 2019-04-20 NOTE — Telephone Encounter (Signed)
Patient would like to try medication for depression. Has f/u on 05/11/19. She is okay with you sending in something and following up with you at her appt or waiting until appt to treat, whichever you prefer.

## 2019-04-21 ENCOUNTER — Other Ambulatory Visit: Payer: Self-pay | Admitting: Internal Medicine

## 2019-04-21 DIAGNOSIS — F32A Depression, unspecified: Secondary | ICD-10-CM

## 2019-04-21 DIAGNOSIS — F329 Major depressive disorder, single episode, unspecified: Secondary | ICD-10-CM

## 2019-04-21 MED ORDER — SERTRALINE HCL 25 MG PO TABS: 25.0000 mg | ORAL_TABLET | Freq: Every day | ORAL | 0 refills | Status: DC

## 2019-04-21 NOTE — Telephone Encounter (Signed)
Sent zoloft 25 mg 1x per [place]  If she has side effects headache, fatigue, nausea let me know and we can consider another option   Does want to also see a therapist?

## 2019-04-21 NOTE — Telephone Encounter (Signed)
Left message for patient to call back  

## 2019-04-23 NOTE — Telephone Encounter (Signed)
Left message to call back  

## 2019-04-26 NOTE — Telephone Encounter (Signed)
Closing note due to no return call back. 

## 2019-05-07 ENCOUNTER — Other Ambulatory Visit: Payer: Self-pay

## 2019-05-11 ENCOUNTER — Ambulatory Visit: Payer: BC Managed Care – PPO | Admitting: Internal Medicine

## 2019-05-11 ENCOUNTER — Encounter: Payer: Self-pay | Admitting: Internal Medicine

## 2019-05-11 ENCOUNTER — Other Ambulatory Visit: Payer: Self-pay

## 2019-05-11 VITALS — BP 102/60 | HR 64 | Temp 97.1°F | Ht 63.0 in | Wt 142.8 lb

## 2019-05-11 DIAGNOSIS — Z0000 Encounter for general adult medical examination without abnormal findings: Secondary | ICD-10-CM

## 2019-05-11 DIAGNOSIS — R110 Nausea: Secondary | ICD-10-CM | POA: Diagnosis not present

## 2019-05-11 DIAGNOSIS — M1990 Unspecified osteoarthritis, unspecified site: Secondary | ICD-10-CM

## 2019-05-11 DIAGNOSIS — Z23 Encounter for immunization: Secondary | ICD-10-CM

## 2019-05-11 DIAGNOSIS — F419 Anxiety disorder, unspecified: Secondary | ICD-10-CM

## 2019-05-11 DIAGNOSIS — F329 Major depressive disorder, single episode, unspecified: Secondary | ICD-10-CM

## 2019-05-11 DIAGNOSIS — E785 Hyperlipidemia, unspecified: Secondary | ICD-10-CM

## 2019-05-11 MED ORDER — ESCITALOPRAM OXALATE 5 MG PO TABS: 5.0000 mg | ORAL_TABLET | Freq: Every day | ORAL | 11 refills | Status: DC

## 2019-05-11 NOTE — Progress Notes (Signed)
Chief Complaint  Patient presents with  . Follow-up   Annual  1. Depression noted on psych testing started zoloft 25 mg had to cut in 1/2 due to significant nausea x 2 days and also tried to take a night which kept her up at night  2. C/o right middle end of finger with nodule and painful and heard a popping sound after picking up light groceries tried popsicle stick and tap and ice with some relief declines Xray today  3. HLD reviewed labs given cholesterol handout today    Review of Systems  Constitutional: Negative for weight loss.  HENT: Negative for hearing loss.   Eyes: Negative for blurred vision.  Respiratory: Negative for shortness of breath.   Cardiovascular: Negative for chest pain.  Gastrointestinal: Positive for nausea. Negative for abdominal pain.  Musculoskeletal: Positive for joint pain.  Skin: Negative for rash.  Neurological: Negative for headaches.  Psychiatric/Behavioral: Positive for depression.       Stress at work     Past Medical History:  Diagnosis Date  . 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 Bonney Aid   . Allergy   . Anxiety   . Arthritis   . Chicken pox   . Depression   . Gastric ulcer    2/2 NSAIDS  . GERD (gastroesophageal reflux disease)   . Headache   . HPV in female   . Hyperlipidemia   . Kidney stone   . Migraine    Past Surgical History:  Procedure Laterality Date  . COLONOSCOPY WITH PROPOFOL N/A 02/14/2015   Procedure: COLONOSCOPY WITH PROPOFOL;  Surgeon: Earline Mayotte, MD;  Location: [facility] ENDOSCOPY;  Service: Endoscopy;  Laterality: N/A;  . ectocervix biopsy     01/2018 LSIL/CIN 1 Dr. Bonney Aid   . HERNIA REPAIR Left   . TONSILLECTOMY     1985  . UPPER GI ENDOSCOPY     Family History  Problem Relation Age of Onset  . Arthritis Mother   . Hearing loss Mother   . Hypertension Mother   . Arthritis Father   . Hearing loss Father   . Hyperlipidemia Father   . Hypertension Father   . Arthritis Maternal  Grandmother   . Alcohol abuse Maternal Grandfather   . Arthritis Maternal Grandfather   . Kidney disease Maternal Grandfather   . Arthritis Paternal Grandmother   . Cancer Paternal Grandmother        breast  . Arthritis Paternal Grandfather   . Heart disease Paternal Grandfather   . Hyperlipidemia Paternal Grandfather   . Hypertension Paternal Grandfather   . Breast cancer Neg Hx    Social History   Socioeconomic History  . Marital status: Married    Spouse name: Not on file  . Number of children: Not on file  . Years of education: Not on file  . Highest education level: Not on file  Occupational History  . Not on file  Social Needs  . Financial resource strain: Not on file  . Food insecurity    Worry: Not on file    Inability: Not on file  . Transportation needs    Medical: Not on file    Non-medical: Not on file  Tobacco Use  . Smoking status: Former Smoker    Years: 7.00    Quit date: 08/26/1992    Years since quitting: 26.7  . Smokeless tobacco: Never Used  Substance and Sexual Activity  . Alcohol use: Yes    Alcohol/week:  0.0 standard drinks    Comment: occasionally  . Drug use: No  . Sexual activity: Yes    Birth control/protection: None  Lifestyle  . Physical activity    Days per week: Not on file    Minutes per session: Not on file  . Stress: Not on file  Relationships  . Social Herbalist on phone: Not on file    Gets together: Not on file    Attends religious service: Not on file    Active member of club or organization: Not on file    Attends meetings of clubs or organizations: Not on file    Relationship status: Not on file  . Intimate partner violence    Fear of current or ex partner: Not on file    Emotionally abused: Not on file    Physically abused: Not on file    Forced sexual activity: Not on file  Other Topics Concern  . Not on file  Social History Narrative   Married    3 kids    Teacher-Library    Masters degree   Feels  safe in relationship, wears seat belt, owns guns    Current Meds  Medication Sig  . [DISCONTINUED] sertraline (ZOLOFT) 25 MG tablet Take 1 tablet (25 mg total) by mouth daily.   Allergies  Allergen Reactions  . Aleve [Naproxen] Swelling   Recent Results (from the past 2160 hour(s))  Vitamin D (25 hydroxy)     Status: None   Collection Time: 03/04/19  8:15 AM  Result Value Ref Range   VITD 31.01 30.00 - 100.00 ng/mL  Urinalysis, Routine w reflex microscopic     Status: None   Collection Time: 03/04/19  8:15 AM  Result Value Ref Range   Color, Urine YELLOW YELLOW   APPearance CLEAR CLEAR   Specific Gravity, Urine 1.023 1.001 - 1.03   pH 6.5 5.0 - 8.0   Glucose, UA NEGATIVE NEGATIVE   Bilirubin Urine NEGATIVE NEGATIVE   Ketones, ur NEGATIVE NEGATIVE   Hgb urine dipstick NEGATIVE NEGATIVE   Protein, ur NEGATIVE NEGATIVE   Nitrite NEGATIVE NEGATIVE   Leukocytes,Ua NEGATIVE NEGATIVE  TSH     Status: None   Collection Time: 03/04/19  8:15 AM  Result Value Ref Range   TSH 2.38 0.35 - 4.50 uIU/mL  Lipid panel     Status: Abnormal   Collection Time: 03/04/19  8:15 AM  Result Value Ref Range   Cholesterol 208 (H) 0 - 200 mg/dL    Comment: ATP III Classification       Desirable:  < 200 mg/dL               Borderline High:  200 - 239 mg/dL          High:  > = 240 mg/dL   Triglycerides 81.0 0.0 - 149.0 mg/dL    Comment: Normal:  <150 mg/dLBorderline High:  150 - 199 mg/dL   HDL 61.00 >39.00 mg/dL   VLDL 16.2 0.0 - 40.0 mg/dL   LDL Cholesterol 131 (H) 0 - 99 mg/dL   Total CHOL/HDL Ratio 3     Comment:                Men          Women1/2 Average Risk     3.4          3.3Average Risk          5.0  4.42X Average Risk          9.6          7.13X Average Risk          15.0          11.0                       NonHDL 147.45     Comment: NOTE:  Non-HDL goal should be 30 mg/dL higher than patient's LDL goal (i.e. LDL goal of < 70 mg/dL, would have non-HDL goal of < 100 mg/dL)  CBC  w/Diff     Status: None   Collection Time: 03/04/19  8:15 AM  Result Value Ref Range   WBC 5.4 4.0 - 10.5 K/uL   RBC 4.13 3.87 - 5.11 Mil/uL   Hemoglobin 12.2 12.0 - 15.0 g/dL   HCT 16.136.1 09.636.0 - 04.546.0 %   MCV 87.4 78.0 - 100.0 fl   MCHC 34.0 30.0 - 36.0 g/dL   RDW 40.912.1 81.111.5 - 91.415.5 %   Platelets 209.0 150.0 - 400.0 K/uL   Neutrophils Relative % 64.6 43.0 - 77.0 %   Lymphocytes Relative 26.7 12.0 - 46.0 %   Monocytes Relative 6.3 3.0 - 12.0 %   Eosinophils Relative 1.9 0.0 - 5.0 %   Basophils Relative 0.5 0.0 - 3.0 %   Neutro Abs 3.5 1.4 - 7.7 K/uL   Lymphs Abs 1.5 0.7 - 4.0 K/uL   Monocytes Absolute 0.3 0.1 - 1.0 K/uL   Eosinophils Absolute 0.1 0.0 - 0.7 K/uL   Basophils Absolute 0.0 0.0 - 0.1 K/uL  Comprehensive metabolic panel     Status: None   Collection Time: 03/04/19  8:15 AM  Result Value Ref Range   Sodium 139 135 - 145 mEq/L   Potassium 4.0 3.5 - 5.1 mEq/L   Chloride 105 96 - 112 mEq/L   CO2 26 19 - 32 mEq/L   Glucose, Bld 90 70 - 99 mg/dL   BUN 20 6 - 23 mg/dL   Creatinine, Ser 7.820.93 0.40 - 1.20 mg/dL   Total Bilirubin 0.4 0.2 - 1.2 mg/dL   Alkaline Phosphatase 85 39 - 117 U/L   AST 16 0 - 37 U/L   ALT 16 0 - 35 U/L   Total Protein 6.9 6.0 - 8.3 g/dL   Albumin 4.6 3.5 - 5.2 g/dL   Calcium 9.1 8.4 - 95.610.5 mg/dL   GFR 21.3062.57 >86.57>60.00 mL/min   Objective  Body mass index is 25.3 kg/m. Wt Readings from Last 3 Encounters:  05/11/19 142 lb 12.8 oz (64.8 kg)  07/07/18 136 lb 6.4 oz (61.9 kg)  02/18/18 137 lb (62.1 kg)   Temp Readings from Last 3 Encounters:  05/11/19 (!) 97.1 F (36.2 C) (Temporal)  07/07/18 98.3 F (36.8 C) (Oral)  01/15/18 98.8 F (37.1 C) (Oral)   BP Readings from Last 3 Encounters:  05/11/19 102/60  07/07/18 112/70  02/18/18 108/62   Pulse Readings from Last 3 Encounters:  05/11/19 64  07/07/18 71  02/10/18 69    Physical Exam Vitals signs and nursing note reviewed.  Constitutional:      Appearance: Normal appearance. She is  well-developed and well-groomed.  HENT:     Head: Normocephalic and atraumatic.     Comments: +mask on   Cardiovascular:     Rate and Rhythm: Normal rate and regular rhythm.     Heart sounds: Normal heart sounds.  Pulmonary:  Effort: Pulmonary effort is normal.     Breath sounds: Normal breath sounds.  Musculoskeletal:       Arms:  Skin:    General: Skin is warm and dry.  Neurological:     General: No focal deficit present.     Mental Status: She is alert and oriented to person, place, and time. Mental status is at baseline.     Gait: Gait normal.  Psychiatric:        Attention and Perception: Attention and perception normal.        Mood and Affect: Mood and affect normal.        Speech: Speech normal.        Behavior: Behavior normal. Behavior is cooperative.        Thought Content: Thought content normal.        Cognition and Memory: Cognition and memory normal.        Judgment: Judgment normal.     Assessment  Plan  Annual physical exam Reviewed labs 03/04/19 +HLD Declines flu shot may get at work  Tdap given today Disc shingrix at f/u to consider  Hep Brec given info to read aboutprev disc not immune   Colonoscopy per pt had 02/14/15 repeat due in 10 years2026 Former smoker quit 1990 smoked 5-7 years max 0.5 ppd Pap5/23/19 neg pap neg HPV -OB/GYN Dr. Bonney AidStaebler h/o abnormal pap and LN2 cervix in HS -LSIL/CIN 1noted on ectocervix bx 02/10/18 due to f/u in 07/2018  03/02/18 left breast calcifications sch dx mammogram 09/03/2018 b/l left breast calcification  -pt due f/u encouraged to call and schedule order in   rec healthy diet choices and exercise  Derm seen in 2020 skin doing better   Anxiety and depression - Plan: escitalopram (LEXAPRO) 5 MG tablet stop zoloft 12.5 mg qd making too nauseated let me know in 2 weeks how doing via my chart   Arthritis hands likely  Declines Xray today if worsening Xray and ortho   HLD given cholesterol handout monitor       Provider: Dr. French Anaracy McLean-Scocuzza-Internal Medicine

## 2019-05-11 NOTE — Patient Instructions (Addendum)
shingrix vaccine x 2 doses consider    Escitalopram tablets What is this medicine? ESCITALOPRAM (es sye TAL oh pram) is used to treat depression and certain types of anxiety. This medicine may be used for other purposes; ask your health care provider or pharmacist if you have questions. COMMON BRAND NAME(S): Lexapro What should I tell my health care provider before I take this medicine? They need to know if you have any of these conditions:  bipolar disorder or a family history of bipolar disorder  diabetes  glaucoma  heart disease  kidney or liver disease  receiving electroconvulsive therapy  seizures (convulsions)  suicidal thoughts, plans, or attempt by you or a family member  an unusual or allergic reaction to escitalopram, the related drug citalopram, other medicines, foods, dyes, or preservatives  pregnant or trying to become pregnant  breast-feeding How should I use this medicine? Take this medicine by mouth with a glass of water. Follow the directions on the prescription label. You can take it with or without food. If it upsets your stomach, take it with food. Take your medicine at regular intervals. Do not take it more often than directed. Do not stop taking this medicine suddenly except upon the advice of your doctor. Stopping this medicine too quickly may cause serious side effects or your condition may worsen. A special MedGuide will be given to you by the pharmacist with each prescription and refill. Be sure to read this information carefully each time. Talk to your pediatrician regarding the use of this medicine in children. Special care may be needed. Overdosage: If you think you have taken too much of this medicine contact a poison control center or emergency room at once. NOTE: This medicine is only for you. Do not share this medicine with others. What if I miss a dose? If you miss a dose, take it as soon as you can. If it is almost time for your next dose,  take only that dose. Do not take double or extra doses. What may interact with this medicine? Do not take this medicine with any of the following medications:  certain medicines for fungal infections like fluconazole, itraconazole, ketoconazole, posaconazole, voriconazole  cisapride  citalopram  dronedarone  linezolid  MAOIs like Carbex, Eldepryl, Marplan, Nardil, and Parnate  methylene blue (injected into a vein)  pimozide  thioridazine This medicine may also interact with the following medications:  alcohol  amphetamines  aspirin and aspirin-like medicines  carbamazepine  certain medicines for depression, anxiety, or psychotic disturbances  certain medicines for migraine headache like almotriptan, eletriptan, frovatriptan, naratriptan, rizatriptan, sumatriptan, zolmitriptan  certain medicines for sleep  certain medicines that treat or prevent blood clots like warfarin, enoxaparin, dalteparin  cimetidine  diuretics  dofetilide  fentanyl  furazolidone  isoniazid  lithium  metoprolol  NSAIDs, medicines for pain and inflammation, like ibuprofen or naproxen  other medicines that prolong the QT interval (cause an abnormal heart rhythm)  procarbazine  rasagiline  supplements like St. John's wort, kava kava, valerian  tramadol  tryptophan  ziprasidone This list may not describe all possible interactions. Give your health care provider a list of all the medicines, herbs, non-prescription drugs, or dietary supplements you use. Also tell them if you smoke, drink alcohol, or use illegal drugs. Some items may interact with your medicine. What should I watch for while using this medicine? Tell your doctor if your symptoms do not get better or if they get worse. Visit your doctor or health care professional  for regular checks on your progress. Because it may take several weeks to see the full effects of this medicine, it is important to continue your  treatment as prescribed by your doctor. Patients and their families should watch out for new or worsening thoughts of suicide or depression. Also watch out for sudden changes in feelings such as feeling anxious, agitated, panicky, irritable, hostile, aggressive, impulsive, severely restless, overly excited and hyperactive, or not being able to sleep. If this happens, especially at the beginning of treatment or after a change in dose, call your health care professional. Bonita Quin may get drowsy or dizzy. Do not drive, use machinery, or do anything that needs mental alertness until you know how this medicine affects you. Do not stand or sit up quickly, especially if you are an older patient. This reduces the risk of dizzy or fainting spells. Alcohol may interfere with the effect of this medicine. Avoid alcoholic drinks. Your mouth may get dry. Chewing sugarless gum or sucking hard candy, and drinking plenty of water may help. Contact your doctor if the problem does not go away or is severe. What side effects may I notice from receiving this medicine? Side effects that you should report to your doctor or health care professional as soon as possible:  allergic reactions like skin rash, itching or hives, swelling of the face, lips, or tongue  anxious  black, tarry stools  changes in vision  confusion  elevated mood, decreased need for sleep, racing thoughts, impulsive behavior  eye pain  fast, irregular heartbeat  feeling faint or lightheaded, falls  feeling agitated, angry, or irritable  hallucination, loss of contact with reality  loss of balance or coordination  loss of memory  painful or prolonged erections  restlessness, pacing, inability to keep still  seizures  stiff muscles  suicidal thoughts or other mood changes  trouble sleeping  unusual bleeding or bruising  unusually weak or tired  vomiting Side effects that usually do not require medical attention (report to your  doctor or health care professional if they continue or are bothersome):  changes in appetite  change in sex drive or performance  headache  increased sweating  indigestion, nausea  tremors This list may not describe all possible side effects. Call your doctor for medical advice about side effects. You may report side effects to FDA at 1-800-FDA-1088. Where should I keep my medicine? Keep out of reach of children. Store at room temperature between 15 and 30 degrees C (59 and 86 degrees F). Throw away any unused medicine after the expiration date. NOTE: This sheet is a summary. It may not cover all possible information. If you have questions about this medicine, talk to your doctor, pharmacist, or health care provider.  2020 Elsevier/Gold Standard (2018-08-03 11:21:44)  Tdap vaccine given today   https://www.cdc.gov/vaccines/hcp/vis/vis-statements/tdap.pdf">  Tdap Vaccine (Tetanus, Diphtheria and Pertussis): What You Need to Know 1. Why get vaccinated? Tetanus, diphtheria and pertussis are very serious diseases. Tdap vaccine can protect [place] from these diseases. And, Tdap vaccine given to pregnant women can protect newborn babies against pertussis.Marland Kitchen TETANUS (Lockjaw) is rare in the [place] today. It causes painful muscle tightening and stiffness, usually all over the body.  It can lead to tightening of muscles in the head and neck so you can't open your mouth, swallow, or sometimes even breathe. Tetanus kills about 1 out of 10 people who are infected even after receiving the best medical care. DIPHTHERIA is also rare in the [place]  today. It can cause a thick coating to form in the back of the throat.  It can lead to breathing problems, heart failure, paralysis, and death. PERTUSSIS (Whooping Cough) causes severe coughing spells, which can cause difficulty breathing, vomiting and disturbed sleep.  It can also lead to weight loss, incontinence, and rib fractures. Up to 2 in  100 adolescents and 5 in 100 adults with pertussis are hospitalized or have complications, which could include pneumonia or death. These diseases are caused by bacteria. Diphtheria and pertussis are spread from person to person through secretions from coughing or sneezing. Tetanus enters the body through cuts, scratches, or wounds. Before vaccines, as many as 200,000 cases of diphtheria, 200,000 cases of pertussis, and hundreds of cases of tetanus, were reported in the [place] each year. Since vaccination began, reports of cases for tetanus and diphtheria have dropped by about 99% and for pertussis by about 80%. 2. Tdap vaccine Tdap vaccine can protect adolescents and adults from tetanus, diphtheria, and pertussis. One dose of Tdap is routinely given at age 55 or 5212. People who did not get Tdap at that age should get it as soon as possible. Tdap is especially important for healthcare professionals and anyone having close contact with a baby younger than 12 months. Pregnant women should get a dose of Tdap during every pregnancy, to protect the newborn from pertussis. Infants are most at risk for severe, life-threatening complications from pertussis. Another vaccine, called Td, protects against tetanus and diphtheria, but not pertussis. A Td booster should be given every 10 years. Tdap may be given as one of these boosters if you have never gotten Tdap before. Tdap may also be given after a severe cut or burn to prevent tetanus infection. Your doctor or the person giving you the vaccine can give you more information. Tdap may safely be given at the same time as other vaccines. 3. Some people should not get this vaccine  A person who has ever had a life-threatening allergic reaction after a previous dose of any diphtheria, tetanus or pertussis containing vaccine, OR has a severe allergy to any part of this vaccine, should not get Tdap vaccine. Tell the person giving the vaccine about any severe  allergies.  Anyone who had coma or long repeated seizures within 7 days after a childhood dose of DTP or DTaP, or a previous dose of Tdap, should not get Tdap, unless a cause other than the vaccine was found. They can still get Td.  Talk to your doctor if you: ? have seizures or another nervous system problem, ? had severe pain or swelling after any vaccine containing diphtheria, tetanus or pertussis, ? ever had a condition called Guillain-Barr Syndrome (GBS), ? aren't feeling well on the [place] the shot is scheduled. 4. Risks With any medicine, including vaccines, there is a chance of side effects. These are usually mild and go away on their own. Serious reactions are also possible but are rare. Most people who get Tdap vaccine do not have any problems with it. Mild problems following Tdap (Did not interfere with activities)  Pain where the shot was given (about 3 in 4 adolescents or 2 in 3 adults)  Redness or swelling where the shot was given (about 1 person in 5)  Mild fever of at least 100.66F (up to about 1 in 25 adolescents or 1 in 100 adults)  Headache (about 3 or 4 people in 10)  Tiredness (about 1 person in 3 or 4)  Nausea, vomiting, diarrhea, stomach ache (up to 1 in 4 adolescents or 1 in 10 adults)  Chills, sore joints (about 1 person in 10)  Body aches (about 1 person in 3 or 4)  Rash, swollen glands (uncommon) Moderate problems following Tdap (Interfered with activities, but did not require medical attention)  Pain where the shot was given (up to 1 in 5 or 6)  Redness or swelling where the shot was given (up to about 1 in 16 adolescents or 1 in 12 adults)  Fever over 102F (about 1 in 100 adolescents or 1 in 250 adults)  Headache (about 1 in 7 adolescents or 1 in 10 adults)  Nausea, vomiting, diarrhea, stomach ache (up to 1 or 3 people in 100)  Swelling of the entire arm where the shot was given (up to about 1 in 500). Severe problems following Tdap (Unable  to perform usual activities; required medical attention)  Swelling, severe pain, bleeding and redness in the arm where the shot was given (rare). Problems that could happen after any vaccine:  People sometimes faint after a medical procedure, including vaccination. Sitting or lying down for about 15 minutes can help prevent fainting, and injuries caused by a fall. Tell your doctor if you feel dizzy, or have vision changes or ringing in the ears.  Some people get severe pain in the shoulder and have difficulty moving the arm where a shot was given. This happens very rarely.  Any medication can cause a severe allergic reaction. Such reactions from a vaccine are very rare, estimated at fewer than 1 in a million doses, and would happen within a few minutes to a few hours after the vaccination. As with any medicine, there is a very remote chance of a vaccine causing a serious injury or death. The safety of vaccines is always being monitored. For more information, visit: http://floyd.org/ 5. What if there is a serious problem? What should I look for?  Look for anything that concerns you, such as signs of a severe allergic reaction, very high fever, or unusual behavior. Signs of a severe allergic reaction can include hives, swelling of the face and throat, difficulty breathing, a fast heartbeat, dizziness, and weakness. These would usually start a few minutes to a few hours after the vaccination. What should I do?  If you think it is a severe allergic reaction or other emergency that can't wait, call 9-1-1 or get the person to the nearest hospital. Otherwise, call your doctor.  Afterward, the reaction should be reported to the Vaccine Adverse Event Reporting System (VAERS). Your doctor might file this report, or you can do it yourself through the VAERS web site at www.vaers.LAgents.no, or by calling 1-503-738-3439. VAERS does not give medical advice. 6. The National Vaccine Injury Compensation  Program The [employer] Vaccine Injury Compensation Program (VICP) is a federal program that was created to compensate people who may have been injured by certain vaccines. Persons who believe they may have been injured by a vaccine can learn about the program and about filing a claim by calling 1-640-607-6119 or visiting the VICP website at SpiritualWord.at. There is a time limit to file a claim for compensation. 7. How can I learn more?  Ask your doctor. He or she can give you the vaccine package insert or suggest other sources of information.  Call your local or state health department.  Contact the Centers for Disease Control and Prevention (CDC): ? Call 470-370-8933 (1-800-CDC-INFO) or ? Visit CDC's website at PicCapture.uy  Vaccine Information Statement Tdap Vaccine (10/19/2013) This information is not intended to replace advice given to you by your health care provider. Make sure you discuss any questions you have with your health care provider. Document Released: 02/11/2012 Document Revised: 03/30/2018 Document Reviewed: 03/30/2018 Elsevier Interactive Patient Education  [employer].   Results for TONNYA, GARBETT (MRN [id number]) as of 05/11/2019 14:41  Ref. Range 12/24/2017 08:33 03/04/2019 08:15  Cholesterol Latest Ref Range: 0 - 200 mg/dL  208 (H)  Cholesterol, Total Latest Ref Range: 100 - 199 mg/dL 222 (H)   HDL Cholesterol Latest Ref Range: >39.00 mg/dL 60 61.00  LDL (calc) Latest Ref Range: 0 - 99 mg/dL 145 (H) 131 (H)  NonHDL Unknown  147.45  Triglycerides Latest Ref Range: 0.0 - 149.0 mg/dL 87 81.0  VLDL Latest Ref Range: 0.0 - 40.0 mg/dL  16.2  VLDL Cholesterol Cal Latest Ref Range: 5 - 40 mg/dL 17     Zoster Vaccine, Recombinant injection What is this medicine? ZOSTER VACCINE (ZOS ter vak SEEN) is used to prevent shingles in adults 55 years old and over. This vaccine is not used to treat shingles or nerve pain from shingles. This medicine may be  used for other purposes; ask your health care provider or pharmacist if you have questions. COMMON BRAND NAME(S): [facility] What should I tell my health care provider before I take this medicine? They need to know if you have any of these conditions:  blood disorders or disease  cancer like leukemia or lymphoma  immune system problems or therapy  an unusual or allergic reaction to vaccines, other medications, foods, dyes, or preservatives  pregnant or trying to get pregnant  breast-feeding How should I use this medicine? This vaccine is for injection in a muscle. It is given by a health care professional. Talk to your pediatrician regarding the use of this medicine in children. This medicine is not approved for use in children. Overdosage: If you think you have taken too much of this medicine contact a poison control center or emergency room at once. NOTE: This medicine is only for you. Do not share this medicine with others. What if I miss a dose? Keep appointments for follow-up (booster) doses as directed. It is important not to miss your dose. Call your doctor or health care professional if you are unable to keep an appointment. What may interact with this medicine?  medicines that suppress your immune system  medicines to treat cancer  steroid medicines like prednisone or cortisone This list may not describe all possible interactions. Give your health care provider a list of all the medicines, herbs, non-prescription drugs, or dietary supplements you use. Also tell them if you smoke, drink alcohol, or use illegal drugs. Some items may interact with your medicine. What should I watch for while using this medicine? Visit your doctor for regular check ups. This vaccine, like all vaccines, may not fully protect everyone. What side effects may I notice from receiving this medicine? Side effects that you should report to your doctor or health care professional as soon as  possible:  allergic reactions like skin rash, itching or hives, swelling of the face, lips, or tongue  breathing problems Side effects that usually do not require medical attention (report these to your doctor or health care professional if they continue or are bothersome):  chills  headache  fever  nausea, vomiting  redness, warmth, pain, swelling or itching at site where injected  tiredness This list may  not describe all possible side effects. Call your doctor for medical advice about side effects. You may report side effects to FDA at 1-800-FDA-1088. Where should I keep my medicine? This vaccine is only given in a clinic, pharmacy, doctor's office, or other health care setting and will not be stored at home. NOTE: This sheet is a summary. It may not cover all possible information. If you have questions about this medicine, talk to your doctor, pharmacist, or health care provider.  2020 Elsevier/Gold Standard (2017-03-24 13:20:30)

## 2019-05-12 ENCOUNTER — Encounter: Payer: Self-pay | Admitting: Internal Medicine

## 2019-05-12 DIAGNOSIS — Z0000 Encounter for general adult medical examination without abnormal findings: Secondary | ICD-10-CM | POA: Insufficient documentation

## 2019-06-28 ENCOUNTER — Encounter: Payer: Self-pay | Admitting: Internal Medicine

## 2019-06-29 ENCOUNTER — Other Ambulatory Visit: Payer: Self-pay | Admitting: *Deleted

## 2019-06-29 DIAGNOSIS — Z20822 Contact with and (suspected) exposure to covid-19: Secondary | ICD-10-CM

## 2019-06-30 LAB — NOVEL CORONAVIRUS, NAA: SARS-CoV-2, NAA: NOT DETECTED

## 2019-11-09 ENCOUNTER — Ambulatory Visit: Payer: BC Managed Care – PPO | Admitting: Internal Medicine

## 2020-02-08 ENCOUNTER — Ambulatory Visit: Payer: BC Managed Care – PPO | Admitting: Internal Medicine

## 2020-02-08 ENCOUNTER — Encounter: Payer: Self-pay | Admitting: Internal Medicine

## 2020-02-08 ENCOUNTER — Other Ambulatory Visit: Payer: Self-pay

## 2020-02-08 VITALS — BP 90/64 | HR 75 | Temp 97.0°F | Ht 63.0 in | Wt 152.8 lb

## 2020-02-08 DIAGNOSIS — M545 Low back pain, unspecified: Secondary | ICD-10-CM

## 2020-02-08 DIAGNOSIS — M25522 Pain in left elbow: Secondary | ICD-10-CM

## 2020-02-08 DIAGNOSIS — G8929 Other chronic pain: Secondary | ICD-10-CM

## 2020-02-08 DIAGNOSIS — G43019 Migraine without aura, intractable, without status migrainosus: Secondary | ICD-10-CM

## 2020-02-08 DIAGNOSIS — M542 Cervicalgia: Secondary | ICD-10-CM | POA: Diagnosis not present

## 2020-02-08 DIAGNOSIS — R519 Headache, unspecified: Secondary | ICD-10-CM

## 2020-02-08 DIAGNOSIS — F419 Anxiety disorder, unspecified: Secondary | ICD-10-CM

## 2020-02-08 DIAGNOSIS — Z1329 Encounter for screening for other suspected endocrine disorder: Secondary | ICD-10-CM

## 2020-02-08 DIAGNOSIS — F339 Major depressive disorder, recurrent, unspecified: Secondary | ICD-10-CM

## 2020-02-08 DIAGNOSIS — Z0000 Encounter for general adult medical examination without abnormal findings: Secondary | ICD-10-CM

## 2020-02-08 DIAGNOSIS — E785 Hyperlipidemia, unspecified: Secondary | ICD-10-CM

## 2020-02-08 DIAGNOSIS — Z13220 Encounter for screening for lipoid disorders: Secondary | ICD-10-CM

## 2020-02-08 DIAGNOSIS — L570 Actinic keratosis: Secondary | ICD-10-CM

## 2020-02-08 DIAGNOSIS — M25552 Pain in left hip: Secondary | ICD-10-CM | POA: Diagnosis not present

## 2020-02-08 DIAGNOSIS — Z1389 Encounter for screening for other disorder: Secondary | ICD-10-CM

## 2020-02-08 DIAGNOSIS — Z1283 Encounter for screening for malignant neoplasm of skin: Secondary | ICD-10-CM

## 2020-02-08 DIAGNOSIS — R921 Mammographic calcification found on diagnostic imaging of breast: Secondary | ICD-10-CM

## 2020-02-08 NOTE — Progress Notes (Addendum)
Chief Complaint  Patient presents with  . Follow-up  . Hip Pain    Left side   F/u  1. C/o left hip pain x 6 months to 1 year worsening Xray [facility] has Xrays of spine and hip will get records pain with walking at times and she is limping at times, sitting pain is ok but left hip pain wakes her up at night lying on that side or even if lies on right side hip pain radiates to left 5/10. 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 She also reports left elbow pain at times since late 3 or 11/2019 when she slipped on a step. Nothing tried  2. 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 C/o h/a after orgasm severe lasting 7-10 minutes most severe to total 30 minutes. She does have h/o migraines since HS and will normally get 1 per year nothing tried. Baker with imaging for now   Review of Systems  Constitutional: Negative for weight loss.  HENT: Negative for hearing loss.   Eyes: Negative for blurred vision.  Respiratory: Negative for shortness of breath.   Gastrointestinal: Negative for abdominal pain.  Musculoskeletal: Positive for back pain, joint pain and neck pain.  Skin: Negative for rash.  Neurological: Positive for headaches.  Psychiatric/Behavioral: Negative for depression. The patient is nervous/anxious.    Past Medical History:  Diagnosis Date  . 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 Georgianne Fick   . Allergy   . Anxiety   . Arthritis   . Chicken pox   . Depression   . Gastric ulcer    2/2 NSAIDS  . GERD (gastroesophageal reflux disease)   . Headache   . HPV in female   . Hyperlipidemia   . Kidney stone   . Migraine    Past Surgical History:  Procedure Laterality Date  . COLONOSCOPY WITH PROPOFOL N/A 02/14/2015   Procedure: COLONOSCOPY WITH PROPOFOL;  Surgeon: Robert Bellow, MD;  Location: [facility] ENDOSCOPY;  Service: Endoscopy;  Laterality: N/A;  . ectocervix biopsy     01/2018 LSIL/CIN 1 Dr. Georgianne Fick   . HERNIA REPAIR Left   . TONSILLECTOMY     1985  . UPPER GI ENDOSCOPY     Family History  Problem Relation Age of Onset  . Arthritis Mother   . Hearing loss Mother   . Hypertension Mother   . Arthritis Father   . Hearing loss Father   . Hyperlipidemia Father   . Hypertension Father   . Arthritis Maternal Grandmother   . Alcohol abuse Maternal Grandfather   . Arthritis Maternal Grandfather   . Kidney disease Maternal Grandfather   . Arthritis Paternal Grandmother   . Cancer Paternal Grandmother        breast  . Arthritis Paternal Grandfather   . Heart disease Paternal Grandfather   . Hyperlipidemia Paternal Grandfather   . Hypertension Paternal Grandfather   . Breast cancer Neg Hx    Social History   Socioeconomic History  . Marital status: Married    Spouse name: Not on file  . Number of children: Not on file  . Years of education: Not on file  . Highest education level: Not on file  Occupational History  . Not on file  Tobacco Use  . Smoking status: Former Smoker    Years: 7.00    Quit date: 08/26/1992    Years since quitting: 27.4  . Smokeless tobacco: Never Used  Vaping Use  . Vaping Use: Never used  Substance and Sexual Activity  . Alcohol use: Yes    Alcohol/week: 0.0 standard drinks    Comment: occasionally  . Drug use: No  . Sexual activity: Yes    Birth control/protection: None  Other Topics Concern  . 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 Difficulty of Paying Living Expenses:   Food Insecurity:   . Worried About Programme researcher, broadcasting/film/video in the Last Year:   . Barista  in the Last Year:   Transportation Needs:   . Freight forwarder (Medical):   Marland Kitchen Lack of Transportation (Non-Medical):   Physical Activity:   . Days of Exercise per Week:   . Minutes of Exercise per Session:   Stress:   . Feeling of Stress :   Social Connections:   . Frequency of Communication with Friends and Family:   . Frequency of Social Gatherings with Friends and Family:   . Attends Religious Services:   . Active Member of Clubs or Organizations:   . Attends Banker Meetings:   Marland Kitchen Marital Status:   Intimate Partner Violence:   . Fear of Current or Ex-Partner:   . Emotionally Abused:   Marland Kitchen Physically Abused:   . Sexually Abused:    Current Meds  Medication Sig  . OVER THE COUNTER MEDICATION Doterra vitamins   Allergies  Allergen Reactions  . Aleve [Naproxen] Swelling   No results found for this or any previous visit (from the past 2160 hour(s)). Objective  Body mass index is 27.07 kg/m. Wt Readings from Last 3 Encounters:  02/08/20 152 lb 12.8 oz (69.3 kg)  05/11/19 142 lb 12.8 oz (64.8 kg)  07/07/18 136 lb 6.4 oz (61.9 kg)   Temp Readings from Last 3 Encounters:  02/08/20 (!) 97 F (36.1 C) (Temporal)  05/11/19 (!) 97.1 F (36.2 C) (Temporal)  07/07/18 98.3 F (36.8 C) (Oral)   BP Readings from Last 3 Encounters:  02/08/20 90/64  05/11/19 102/60  07/07/18 112/70   Pulse Readings from Last 3 Encounters:  02/08/20 75  05/11/19 64  07/07/18 71    Physical Exam Vitals and nursing note reviewed.  Constitutional:      Appearance: Normal appearance. She is well-developed and well-groomed.  HENT:     Head: Normocephalic and atraumatic.  Eyes:     Conjunctiva/sclera: Conjunctivae normal.     Pupils: Pupils are equal, round, and reactive to light.  Cardiovascular:     Rate and Rhythm: Normal rate and regular rhythm.     Heart sounds: Normal heart sounds. No murmur heard.   Pulmonary:     Effort: Pulmonary effort is normal.     Breath  sounds: Normal breath sounds.  Musculoskeletal:       Back:       Legs:  Skin:    General: Skin is warm and dry.     Comments: aks changes arms and legs  Neurological:     General: No focal deficit present.     Mental Status: She is alert and oriented to person, place, and time. Mental status is at baseline.     Gait: Gait normal.  Psychiatric:        Attention and Perception: Attention and perception normal.        Mood and Affect: Mood and affect normal.        Speech: Speech normal.        Behavior: Behavior normal. Behavior is cooperative.        Thought Content: Thought content normal.        Cognition and Memory: Cognition and memory normal.        Judgment: Judgment normal.     Assessment  Plan  Left hip pain - Plan: Ambulatory referral to Orthopedic Surgery, Comprehensive metabolic panel, CBC with Differential/Platelet  Left elbow pain - Plan: Ambulatory referral to Orthopedic Surgery  Cervicalgia - Plan: Ambulatory referral to Orthopedic Surgery  Chronic low back pain without sciatica, unspecified back pain laterality - Plan: Ambulatory referral to Orthopedic Surgery -copies of Xrays [facility]   Xray report 02/15/20 Moderate decreased lumbar anterior lordotic curvature  Severe decrease in L4/5 disc heights and bone spurring  Severe left scoliosis L3 to T12 Multiple rotational malpositions of cervical, T and L spine  Pelvic unleveling noted with high right ileum  Pelvic obliquity noted on right rotation   Intractable episodic headache s/p orgasm, unspecified headache type, intractable - Plan: MR Brain Wo Contrast Consider neurology in future  Actinic keratosis - Plan: Ambulatory referral to Dermatology Dr. Roseanne Kaufman tbse   Depression, recurrent (HCC) Anxiety  Declines SSRI tried lexapro, prozac and zoloft  Declines benzo and therapy  Disc meditation   HM Reviewed labs 03/04/19 +HLD Declines flu shot may get at work  Tdap utd  Disc shingrix at f/u to  consider  Hep Brec given info to read aboutprev disc not immune   Colonoscopy per pt had 02/14/15 repeat due in 10 years2026 Former smoker quit 1990 smoked 5-7 years max 0.5 ppd Pap5/23/19 neg pap neg HPV -OB/GYN Dr. Bonney Aid h/o abnormal pap and LN2 cervix in HS -LSIL/CIN 1noted on ectocervix bx 02/10/18 due to f/u in 07/2018  03/02/18 left breast calcifications sch dx mammogram 09/03/2018 b/l left breast calcification  -pt due f/u encouraged to call and schedule order in   rec healthy diet choices and exercise  Derm seen in 2020 skin doing better [address]  Referred today  Provider: Dr. French Ana McLean-Scocuzza-Internal Medicine

## 2020-02-08 NOTE — Patient Instructions (Addendum)
Dr. Odis Luster ortho   voltaren gel  Lidocaine pain patches   Tumeric/ginger/curcumin Glucosamine/chondroitin   Anxiety nature made L theanine 100-200 mg  Or  Stress relax brand tranquil sleep (amazon or whole foods)   Consider therapy  Meditation apps insight timer, calm, headspace, bloom, replika    Please call Dr. Roseanne Kaufman [phone] for total body skin check    Mindfulness-Based Stress Reduction Mindfulness-based stress reduction (MBSR) is a program that helps people learn to practice mindfulness. Mindfulness is the practice of intentionally paying attention to the present moment. It can be learned and practiced through techniques such as education, breathing exercises, meditation, and yoga. MBSR includes several mindfulness techniques in one program. MBSR works best when you understand the treatment, are willing to try new things, and can commit to spending time practicing what you learn. MBSR training may include learning about:  How your emotions, thoughts, and reactions affect your body.  New ways to respond to things that cause negative thoughts to start (triggers).  How to notice your thoughts and let go of them.  Practicing awareness of everyday things that you normally do without thinking.  The techniques and goals of different types of meditation. What are the benefits of MBSR? MBSR can have many benefits, which include helping you to:  Develop self-awareness. This refers to knowing and understanding yourself.  Learn skills and attitudes that help you to participate in your own health care.  Learn new ways to care for yourself.  Be more accepting about how things are, and let things go.  Be less judgmental and approach things with an open mind.  Be patient with yourself and trust yourself more. MBSR has also been shown to:  Reduce negative emotions, such as depression and anxiety.  Improve memory and focus.  Change how you sense and approach  pain.  Boost your body's ability to fight infections.  Help you connect better with other people.  Improve your sense of well-being. Follow these instructions at home:   Find a local in-person or online MBSR program.  Set aside some time regularly for mindfulness practice.  Find a mindfulness practice that works best for you. This may include one or more of the following: ? Meditation. Meditation involves focusing your mind on a certain thought or activity. ? Breathing awareness exercises. These help you to stay present by focusing on your breath. ? Body scan. For this practice, you lie down and pay attention to each part of your body from head to toe. You can identify tension and soreness and intentionally relax parts of your body. ? Yoga. Yoga involves stretching and breathing, and it can improve your ability to move and be flexible. It can also provide an experience of testing your body's limits, which can help you release stress. ? Mindful eating. This way of eating involves focusing on the taste, texture, color, and smell of each bite of food. Because this slows down eating and helps you feel full sooner, it can be an important part of a weight-loss plan.  Find a podcast or recording that provides guidance for breathing awareness, body scan, or meditation exercises. You can listen to these any time when you have a free moment to rest without distractions.  Follow your treatment plan as told by your health care provider. This may include taking regular medicines and making changes to your diet or lifestyle as recommended. How to practice mindfulness To do a basic awareness exercise:  Find a comfortable place to  sit.  Pay attention to the present moment. Observe your thoughts, feelings, and surroundings just as they are.  Avoid placing judgment on yourself, your feelings, or your surroundings. Make note of any judgment that comes up, and let it go.  Your mind may wander, and that  is okay. Make note of when your thoughts drift, and return your attention to the present moment. To do basic mindfulness meditation:  Find a comfortable place to sit. This may include a stable chair or a firm floor cushion. ? Sit upright with your back straight. Let your arms fall next to your side with your hands resting on your legs. ? If sitting in a chair, rest your feet flat on the floor. ? If sitting on a cushion, cross your legs in front of you.  Keep your head in a neutral position with your chin dropped slightly. Relax your jaw and rest the tip of your tongue on the roof of your mouth. Drop your gaze to the floor. You can close your eyes if you like.  Breathe normally and pay attention to your breath. Feel the air moving in and out of your nose. Feel your belly expanding and relaxing with each breath.  Your mind may wander, and that is okay. Make note of when your thoughts drift, and return your attention to your breath.  Avoid placing judgment on yourself, your feelings, or your surroundings. Make note of any judgment or feelings that come up, let them go, and bring your attention back to your breath.  When you are ready, lift your gaze or open your eyes. Pay attention to how your body feels after the meditation. Where to find more information You can find more information about MBSR from:  Your health care provider.  Community-based meditation centers or programs.  Programs offered near you. Summary  Mindfulness-based stress reduction (MBSR) is a program that teaches you how to intentionally pay attention to the present moment. It is used with other treatments to help you cope better with daily stress, emotions, and pain.  MBSR focuses on developing self-awareness, which allows you to respond to life stress without judgment or negative emotions.  MBSR programs may involve learning different mindfulness practices, such as breathing exercises, meditation, yoga, body scan, or  mindful eating. Find a mindfulness practice that works best for you, and set aside time for it on a regular basis. This information is not intended to replace advice given to you by your health care provider. Make sure you discuss any questions you have with your health care provider. Document Revised: 07/25/2017 Document Reviewed: 12/19/2016 Elsevier Patient Education  2020 [employer] Inc.   Panic Attack A panic attack is a sudden episode of severe anxiety, fear, or discomfort that causes physical and emotional symptoms. The attack may be in response to something frightening, or it may occur for no known reason. Symptoms of a panic attack can be similar to symptoms of a heart attack or stroke. It is important to see your health care provider when you have a panic attack so that these conditions can be ruled out. A panic attack is a symptom of another condition. Most panic attacks go away with treatment of the underlying problem. If you have panic attacks often, you may have a condition called panic disorder. What are the causes? A panic attack may be caused by:  An extreme, life-threatening situation, such as a war or natural disaster.  An anxiety disorder, such as post-traumatic stress disorder.  Depression.  Certain medical conditions, including heart problems, neurological conditions, and infections.  Certain over-the-counter and prescription medicines.  Illegal drugs that increase heart rate and blood pressure, such as methamphetamine.  Alcohol.  Supplements that increase anxiety.  Panic disorder. What increases the risk? You are more likely to develop this condition if:  You have an anxiety disorder.  You have another mental health condition.  You take certain medicines.  You use alcohol, illegal drugs, or other substances.  You are under extreme stress.  A life event is causing increased feelings of anxiety and depression. What are the signs or symptoms? A panic  attack starts suddenly, usually lasts about 20 minutes, and occurs with one or more of the following:  A pounding heart.  A feeling that your heart is beating irregularly or faster than normal (palpitations).  Sweating.  Trembling or shaking.  Shortness of breath or feeling smothered.  Feeling choked.  Chest pain or discomfort.  Nausea or a strange feeling in your stomach.  Dizziness, feeling lightheaded, or feeling like you might faint.  Chills or hot flashes.  Numbness or tingling in your lips, hands, or feet.  Feeling confused, or feeling that you are not yourself.  Fear of losing control or being emotionally unstable.  Fear of dying. How is this diagnosed? A panic attack is diagnosed with an assessment by your health care provider. During the assessment your health care provider will ask questions about:  Your history of anxiety, depression, and panic attacks.  Your medical history.  Whether you drink alcohol, use illegal drugs, take supplements, or take medicines. Be honest about your substance use. Your health care provider may also:  Order blood tests or other kinds of tests to rule out serious medical conditions.  Refer you to a mental health professional for further evaluation. How is this treated? Treatment depends on the cause of the panic attack:  If the cause is a medical problem, your health care provider will either treat that problem or refer you to a specialist.  If the cause is emotional, you may be given anti-anxiety medicines or referred to a counselor. These medicines may reduce how often attacks happen, reduce how severe the attacks are, and lower anxiety.  If the cause is a medicine, your health care provider may tell you to stop the medicine, change your dose, or take a different medicine.  If the cause is a drug, treatment may involve letting the drug wear off and taking medicine to help the drug leave your body or to counteract its effects.  Attacks caused by drug abuse may continue even if you stop using the drug. Follow these instructions at home:  Take over-the-counter and prescription medicines only as told by your health care provider.  If you feel anxious, limit your caffeine intake.  Take good care of your physical and mental health by: ? Eating a balanced diet that includes plenty of fresh fruits and vegetables, whole grains, lean meats, and low-fat dairy. ? Getting plenty of rest. Try to get 7-8 hours of uninterrupted sleep each night. ? Exercising regularly. Try to get 30 minutes of physical activity at least 5 days a week. ? Not smoking. Talk to your health care provider if you need help quitting. ? Limiting alcohol intake to no more than 1 drink a [place] for nonpregnant women and 2 drinks a Feinberg for men. One drink equals 12 oz of beer, 5 oz of wine, or 1 oz of hard liquor.  Keep all follow-up  visits as told by your health care provider. This is important. Panic attacks may have underlying physical or emotional problems that take time to accurately diagnose. Contact a health care provider if:  Your symptoms do not improve, or they get worse.  You are not able to take your medicine as prescribed because of side effects. Get help right away if:  You have serious thoughts about hurting yourself or others.  You have symptoms of a panic attack. Do not drive yourself to the hospital. Have someone else drive you or call an ambulance. If you ever feel like you may hurt yourself or others, or you have thoughts about taking your own life, get help right away. You can go to your nearest emergency department or call:  Your local emergency services (911 in the [place]  A suicide crisis helpline, such as the National Suicide Prevention Lifeline at 6053013087. This is open 24 hours a [place]. Summary  A panic attack is a sign of a serious health or mental health condition. Get help right away. Do not drive yourself to the  hospital. Have someone else drive you or call an ambulance.  Always see a health care provider to have the reasons for the panic attack correctly diagnosed.  If your panic attack was caused by a physical problem, follow your health care provider's suggestions for medicine, referral to a specialist, and lifestyle changes.  If your panic attack was caused by an emotional problem, follow through with counseling from a qualified mental health specialist.  If you feel like you may hurt yourself or others, call 911 and get help right away. This information is not intended to replace advice given to you by your health care provider. Make sure you discuss any questions you have with your health care provider. Document Revised: 07/25/2017 Document Reviewed: 09/20/2016 Elsevier Patient Education  2020 [employer].  Low Back Sprain or Strain Rehab Ask your health care provider which exercises are safe for you. Do exercises exactly as told by your health care provider and adjust them as directed. It is normal to feel mild stretching, pulling, tightness, or discomfort as you do these exercises. Stop right away if you feel sudden pain or your pain gets worse. Do not begin these exercises until told by your health care provider. Stretching and range-of-motion exercises These exercises warm up your muscles and joints and improve the movement and flexibility of your back. These exercises also help to relieve pain, numbness, and tingling. Lumbar rotation  1. Lie on your back on a firm surface and bend your knees. 2. Straighten your arms out to your sides so each arm forms a 90-degree angle (right angle) with a side of your body. 3. Slowly move (rotate) both of your knees to one side of your body until you feel a stretch in your lower back (lumbar). Try not to let your shoulders lift off the floor. 4. Hold this position for __________ seconds. 5. Tense your abdominal muscles and slowly move your knees back to  the starting position. 6. Repeat this exercise on the other side of your body. Repeat __________ times. Complete this exercise __________ times a Ranney. Single knee to chest  1. Lie on your back on a firm surface with both legs straight. 2. Bend one of your knees. Use your hands to move your knee up toward your chest until you feel a gentle stretch in your lower back and buttock. ? Hold your leg in this position by holding on  to the front of your knee. ? Keep your other leg as straight as possible. 3. Hold this position for __________ seconds. 4. Slowly return to the starting position. 5. Repeat with your other leg. Repeat __________ times. Complete this exercise __________ times a [place]. Prone extension on elbows  1. Lie on your abdomen on a firm surface (prone position). 2. Prop yourself up on your elbows. 3. Use your arms to help lift your chest up until you feel a gentle stretch in your abdomen and your lower back. ? This will place some of your body weight on your elbows. If this is uncomfortable, try stacking pillows under your chest. ? Your hips should stay down, against the surface that you are lying on. Keep your hip and back muscles relaxed. 4. Hold this position for __________ seconds. 5. Slowly relax your upper body and return to the starting position. Repeat __________ times. Complete this exercise __________ times a Garro. Strengthening exercises These exercises build strength and endurance in your back. Endurance is the ability to use your muscles for a long time, even after they get tired. Pelvic tilt This exercise strengthens the muscles that lie deep in the abdomen. 1. Lie on your back on a firm surface. Bend your knees and keep your feet flat on the floor. 2. Tense your abdominal muscles. Tip your pelvis up toward the ceiling and flatten your lower back into the floor. ? To help with this exercise, you may place a small towel under your lower back and try to push your back  into the towel. 3. Hold this position for __________ seconds. 4. Let your muscles relax completely before you repeat this exercise. Repeat __________ times. Complete this exercise __________ times a [place]. Alternating arm and leg raises  1. Get on your hands and knees on a firm surface. If you are on a hard floor, you may want to use padding, such as an exercise mat, to cushion your knees. 2. Line up your arms and legs. Your hands should be directly below your shoulders, and your knees should be directly below your hips. 3. Lift your left leg behind you. At the same time, raise your right arm and straighten it in front of you. ? Do not lift your leg higher than your hip. ? Do not lift your arm higher than your shoulder. ? Keep your abdominal and back muscles tight. ? Keep your hips facing the ground. ? Do not arch your back. ? Keep your balance carefully, and do not hold your breath. 4. Hold this position for __________ seconds. 5. Slowly return to the starting position. 6. Repeat with your right leg and your left arm. Repeat __________ times. Complete this exercise __________ times a Pardini. Abdominal set with straight leg raise  1. Lie on your back on a firm surface. 2. Bend one of your knees and keep your other leg straight. 3. Tense your abdominal muscles and lift your straight leg up, 4-6 inches (10-15 cm) off the ground. 4. Keep your abdominal muscles tight and hold this position for __________ seconds. ? Do not hold your breath. ? Do not arch your back. Keep it flat against the ground. 5. Keep your abdominal muscles tense as you slowly lower your leg back to the starting position. 6. Repeat with your other leg. Repeat __________ times. Complete this exercise __________ times a Gadd. Single leg lower with bent knees 1. Lie on your back on a firm surface. 2. Tense your abdominal muscles and  lift your feet off the floor, one foot at a time, so your knees and hips are bent in 90-degree  angles (right angles). ? Your knees should be over your hips and your lower legs should be parallel to the floor. 3. Keeping your abdominal muscles tense and your knee bent, slowly lower one of your legs so your toe touches the ground. 4. Lift your leg back up to return to the starting position. ? Do not hold your breath. ? Do not let your back arch. Keep your back flat against the ground. 5. Repeat with your other leg. Repeat __________ times. Complete this exercise __________ times a Capp. Posture and body mechanics Good posture and healthy body mechanics can help to relieve stress in your body's tissues and joints. Body mechanics refers to the movements and positions of your body while you do your daily activities. Posture is part of body mechanics. Good posture means:  Your spine is in its natural S-curve position (neutral).  Your shoulders are pulled back slightly.  Your head is not tipped forward. Follow these guidelines to improve your posture and body mechanics in your everyday activities. Standing   When standing, keep your spine neutral and your feet about hip width apart. Keep a slight bend in your knees. Your ears, shoulders, and hips should line up.  When you do a task in which you stand in one place for a long time, place one foot up on a stable object that is 2-4 inches (5-10 cm) high, such as a footstool. This helps keep your spine neutral. Sitting   When sitting, keep your spine neutral and keep your feet flat on the floor. Use a footrest, if necessary, and keep your thighs parallel to the floor. Avoid rounding your shoulders, and avoid tilting your head forward.  When working at a desk or a computer, keep your desk at a height where your hands are slightly lower than your elbows. Slide your chair under your desk so you are close enough to maintain good posture.  When working at a computer, place your monitor at a height where you are looking straight ahead and you do  not have to tilt your head forward or downward to look at the screen. Resting  When lying down and resting, avoid positions that are most painful for you.  If you have pain with activities such as sitting, bending, stooping, or squatting, lie in a position in which your body does not bend very much. For example, avoid curling up on your side with your arms and knees near your chest (fetal position).  If you have pain with activities such as standing for a long time or reaching with your arms, lie with your spine in a neutral position and bend your knees slightly. Try the following positions: ? Lying on your side with a pillow between your knees. ? Lying on your back with a pillow under your knees. Lifting   When lifting objects, keep your feet at least shoulder width apart and tighten your abdominal muscles.  Bend your knees and hips and keep your spine neutral. It is important to lift using the strength of your legs, not your back. Do not lock your knees straight out.  Always ask for help to lift heavy or awkward objects. This information is not intended to replace advice given to you by your health care provider. Make sure you discuss any questions you have with your health care provider. Document Revised: 12/04/2018 Document Reviewed: 09/03/2018  Elsevier Patient Education  [employer]2020 [employer]  Neck Exercises Ask your health care provider which exercises are safe for you. Do exercises exactly as told by your health care provider and adjust them as directed. It is normal to feel mild stretching, pulling, tightness, or discomfort as you do these exercises. Stop right away if you feel sudden pain or your pain gets worse. Do not begin these exercises until told by your health care provider. Neck exercises can be important for many reasons. They can improve strength and maintain flexibility in your neck, which will help your upper back and prevent neck pain. Stretching exercises Rotation neck  stretching  1. Sit in a chair or stand up. 2. Place your feet flat on the floor, shoulder width apart. 3. Slowly turn your head (rotate) to the right until a slight stretch is felt. Turn it all the way to the right so you can look over your right shoulder. Do not tilt or tip your head. 4. Hold this position for 10-30 seconds. 5. Slowly turn your head (rotate) to the left until a slight stretch is felt. Turn it all the way to the left so you can look over your left shoulder. Do not tilt or tip your head. 6. Hold this position for 10-30 seconds. Repeat __________ times. Complete this exercise __________ times a Forrester. Neck retraction 1. Sit in a sturdy chair or stand up. 2. Look straight ahead. Do not bend your neck. 3. Use your fingers to push your chin backward (retraction). Do not bend your neck for this movement. Continue to face straight ahead. If you are doing the exercise properly, you will feel a slight sensation in your throat and a stretch at the back of your neck. 4. Hold the stretch for 1-2 seconds. Repeat __________ times. Complete this exercise __________ times a [place]. Strengthening exercises Neck press 1. Lie on your back on a firm bed or on the floor with a pillow under your head. 2. Use your neck muscles to push your head down on the pillow and straighten your spine. 3. Hold the position as well as you can. Keep your head facing up (in a neutral position) and your chin tucked. 4. Slowly count to 5 while holding this position. Repeat __________ times. Complete this exercise __________ times a Proud. Isometrics These are exercises in which you strengthen the muscles in your neck while keeping your neck still (isometrics). 1. Sit in a supportive chair and place your hand on your forehead. 2. Keep your head and face facing straight ahead. Do not flex or extend your neck while doing isometrics. 3. Push forward with your head and neck while pushing back with your hand. Hold for 10  seconds. 4. Do the sequence again, this time putting your hand against the back of your head. Use your head and neck to push backward against the hand pressure. 5. Finally, do the same exercise on either side of your head, pushing sideways against the pressure of your hand. Repeat __________ times. Complete this exercise __________ times a Reich. Prone head lifts 1. Lie face-down (prone position), resting on your elbows so that your chest and upper back are raised. 2. Start with your head facing downward, near your chest. Position your chin either on or near your chest. 3. Slowly lift your head upward. Lift until you are looking straight ahead. Then continue lifting your head as far back as you can comfortably stretch. 4. Hold your head up for 5 seconds. Then  slowly lower it to your starting position. Repeat __________ times. Complete this exercise __________ times a Basques. Supine head lifts 1. Lie on your back (supine position), bending your knees to point to the ceiling and keeping your feet flat on the floor. 2. Lift your head slowly off the floor, raising your chin toward your chest. 3. Hold for 5 seconds. Repeat __________ times. Complete this exercise __________ times a Gramm. Scapular retraction 1. Stand with your arms at your sides. Look straight ahead. 2. Slowly pull both shoulders (scapulae) backward and downward (retraction) until you feel a stretch between your shoulder blades in your upper back. 3. Hold for 10-30 seconds. 4. Relax and repeat. Repeat __________ times. Complete this exercise __________ times a Seckinger. Contact a health care provider if:  Your neck pain or discomfort gets much worse when you do an exercise.  Your neck pain or discomfort does not improve within 2 hours after you exercise. If you have any of these problems, stop exercising right away. Do not do the exercises again unless your health care provider says that you can. Get help right away if:  You develop  sudden, severe neck pain. If this happens, stop exercising right away. Do not do the exercises again unless your health care provider says that you can. This information is not intended to replace advice given to you by your health care provider. Make sure you discuss any questions you have with your health care provider. Document Revised: 06/10/2018 Document Reviewed: 06/10/2018 Elsevier Patient Education  2020 [employer]  Hip Exercises Ask your health care provider which exercises are safe for you. Do exercises exactly as told by your health care provider and adjust them as directed. It is normal to feel mild stretching, pulling, tightness, or discomfort as you do these exercises. Stop right away if you feel sudden pain or your pain gets worse. Do not begin these exercises until told by your health care provider. Stretching and range-of-motion exercises These exercises warm up your muscles and joints and improve the movement and flexibility of your hip. These exercises also help to relieve pain, numbness, and tingling. You may be asked to limit your range of motion if you had a hip replacement. Talk to your health care provider about these restrictions. Hamstrings, supine  7. Lie on your back (supine position). 8. Loop a belt or towel over the ball of your left / right foot. The ball of your foot is on the walking surface, right under your toes. 9. Straighten your left / right knee and slowly pull on the belt or towel to raise your leg until you feel a gentle stretch behind your knee (hamstring). ? Do not let your knee bend while you do this. ? Keep your other leg flat on the floor. 10. Hold this position for __________ seconds. 11. Slowly return your leg to the starting position. Repeat __________ times. Complete this exercise __________ times a Levett. Hip rotation  6. Lie on your back on a firm surface. 7. With your left / right hand, gently pull your left / right knee toward the  shoulder that is on the same side of the body. Stop when your knee is pointing toward the ceiling. 8. Hold your left / right ankle with your other hand. 9. Keeping your knee steady, gently pull your left / right ankle toward your other shoulder until you feel a stretch in your buttocks. ? Keep your hips and shoulders firmly planted while you do this  stretch. 10. Hold this position for __________ seconds. Repeat __________ times. Complete this exercise __________ times a Debruin. Seated stretch This exercise is sometimes called hamstrings and adductors stretch. 6. Sit on the floor with your legs stretched wide. Keep your knees straight during this exercise. 7. Keeping your head and back in a straight line, bend at your waist to reach for your left foot (position A). You should feel a stretch in your right inner thigh (adductors). 8. Hold this position for __________ seconds. Then slowly return to the upright position. 9. Keeping your head and back in a straight line, bend at your waist to reach forward (position B). You should feel a stretch behind both of your thighs and knees (hamstrings). 10. Hold this position for __________ seconds. Then slowly return to the upright position. 11. Keeping your head and back in a straight line, bend at your waist to reach for your right foot (position C). You should feel a stretch in your left inner thigh (adductors). 12. Hold this position for __________ seconds. Then slowly return to the upright position. Repeat __________ times. Complete this exercise __________ times a Pester. Lunge This exercise stretches the muscles of the hip (hip flexors). 5. Place your left / right knee on the floor and bend your other knee so that is directly over your ankle. You should be half-kneeling. 6. Keep good posture with your head over your shoulders. 7. Tighten your buttocks to point your tailbone downward. This will prevent your back from arching too much. 8. You should feel a  gentle stretch in the front of your left / right thigh and hip. If you do not feel a stretch, slide your other foot forward slightly and then slowly lunge forward with your chest up until your knee once again lines up over your ankle. ? Make sure your tailbone continues to point downward. 9. Hold this position for __________ seconds. 10. Slowly return to the starting position. Repeat __________ times. Complete this exercise __________ times a Edwin. Strengthening exercises These exercises build strength and endurance in your hip. Endurance is the ability to use your muscles for a long time, even after they get tired. Bridge This exercise strengthens the muscles of your hip (hip extensors). 7. Lie on your back on a firm surface with your knees bent and your feet flat on the floor. 8. Tighten your buttocks muscles and lift your bottom off the floor until the trunk of your body and your hips are level with your thighs. ? Do not arch your back. ? You should feel the muscles working in your buttocks and the back of your thighs. If you do not feel these muscles, slide your feet 1-2 inches (2.5-5 cm) farther away from your buttocks. 9. Hold this position for __________ seconds. 10. Slowly lower your hips to the starting position. 11. Let your muscles relax completely between repetitions. Repeat __________ times. Complete this exercise __________ times a Sharrar. Straight leg raises, side-lying This exercise strengthens the muscles that move the hip joint away from the center of the body (hip abductors). 7. Lie on your side with your left / right leg in the top position. Lie so your head, shoulder, hip, and knee line up. You may bend your bottom knee slightly to help you balance. 8. Roll your hips slightly forward, so your hips are stacked directly over each other and your left / right knee is facing forward. 9. Leading with your heel, lift your top leg 4-6 inches (10-15  cm). You should feel the muscles in  your top hip lifting. ? Do not let your foot drift forward. ? Do not let your knee roll toward the ceiling. 10. Hold this position for __________ seconds. 11. Slowly return to the starting position. 12. Let your muscles relax completely between repetitions. Repeat __________ times. Complete this exercise __________ times a Walle. Straight leg raises, side-lying This exercise strengthens the muscles that move the hip joint toward the center of the body (hip adductors). 6. Lie on your side with your left / right leg in the bottom position. Lie so your head, shoulder, hip, and knee line up. You may place your upper foot in front to help you balance. 7. Roll your hips slightly forward, so your hips are stacked directly over each other and your left / right knee is facing forward. 8. Tense the muscles in your inner thigh and lift your bottom leg 4-6 inches (10-15 cm). 9. Hold this position for __________ seconds. 10. Slowly return to the starting position. 11. Let your muscles relax completely between repetitions. Repeat __________ times. Complete this exercise __________ times a Ekholm. Straight leg raises, supine This exercise strengthens the muscles in the front of your thigh (quadriceps). 1. Lie on your back (supine position) with your left / right leg extended and your other knee bent. 2. Tense the muscles in the front of your left / right thigh. You should see your kneecap slide up or see increased dimpling just above your knee. 3. Keep these muscles tight as you raise your leg 4-6 inches (10-15 cm) off the floor. Do not let your knee bend. 4. Hold this position for __________ seconds. 5. Keep these muscles tense as you lower your leg. 6. Relax the muscles slowly and completely between repetitions. Repeat __________ times. Complete this exercise __________ times a Semper. Hip abductors, standing This exercise strengthens the muscles that move the leg and hip joint away from the center of the body  (hip abductors). 1. Tie one end of a rubber exercise band or tubing to a secure surface, such as a chair, table, or pole. 2. Loop the other end of the band or tubing around your left / right ankle. 3. Keeping your ankle with the band or tubing directly opposite the secured end, step away until there is tension in the tubing or band. Hold on to a chair, table, or pole as needed for balance. 4. Lift your left / right leg out to your side. While you do this: ? Keep your back upright. ? Keep your shoulders over your hips. ? Keep your toes pointing forward. ? Make sure to use your hip muscles to slowly lift your leg. Do not tip your body or forcefully lift your leg. 5. Hold this position for __________ seconds. 6. Slowly return to the starting position. Repeat __________ times. Complete this exercise __________ times a Pevehouse. Squats This exercise strengthens the muscles in the front of your thigh (quadriceps). 1. Stand in a door frame so your feet and knees are in line with the frame. You may place your hands on the frame for balance. 2. Slowly bend your knees and lower your hips like you are going to sit in a chair. ? Keep your lower legs in a straight-up-and-down position. ? Do not let your hips go lower than your knees. ? Do not bend your knees lower than told by your health care provider. ? If your hip pain increases, do not bend as low. 3.  Hold this position for ___________ seconds. 4. Slowly push with your legs to return to standing. Do not use your hands to pull yourself to standing. Repeat __________ times. Complete this exercise __________ times a Palin. This information is not intended to replace advice given to you by your health care provider. Make sure you discuss any questions you have with your health care provider. Document Revised: 03/18/2019 Document Reviewed: 06/23/2018 Elsevier Patient Education  2020 [employer].

## 2020-02-08 NOTE — Progress Notes (Signed)
Left sided hip pain ongoing for a year and a half. No know injury or falls. Has a history of arthritis and sees a [facility]. Had x-rays done 3-4 months ago there.   Pain in 1/10 today, 5/10 with movement, pain is even worse when laying down.

## 2020-02-09 DIAGNOSIS — M25552 Pain in left hip: Secondary | ICD-10-CM | POA: Insufficient documentation

## 2020-02-09 DIAGNOSIS — M545 Low back pain, unspecified: Secondary | ICD-10-CM | POA: Insufficient documentation

## 2020-02-09 DIAGNOSIS — R921 Mammographic calcification found on diagnostic imaging of breast: Secondary | ICD-10-CM | POA: Insufficient documentation

## 2020-02-09 DIAGNOSIS — F339 Major depressive disorder, recurrent, unspecified: Secondary | ICD-10-CM | POA: Insufficient documentation

## 2020-02-09 DIAGNOSIS — G43019 Migraine without aura, intractable, without status migrainosus: Secondary | ICD-10-CM | POA: Insufficient documentation

## 2020-02-09 DIAGNOSIS — M25522 Pain in left elbow: Secondary | ICD-10-CM | POA: Insufficient documentation

## 2020-02-09 DIAGNOSIS — R519 Headache, unspecified: Secondary | ICD-10-CM | POA: Insufficient documentation

## 2020-02-21 ENCOUNTER — Telehealth: Payer: Self-pay | Admitting: Internal Medicine

## 2020-02-21 NOTE — Telephone Encounter (Signed)
Received fax with the patient's lumbar x-rays done September 17, 2019.  Placed on your desk for review.

## 2020-03-02 ENCOUNTER — Other Ambulatory Visit (INDEPENDENT_AMBULATORY_CARE_PROVIDER_SITE_OTHER): Payer: BC Managed Care – PPO

## 2020-03-02 ENCOUNTER — Ambulatory Visit: Payer: BC Managed Care – PPO

## 2020-03-02 ENCOUNTER — Other Ambulatory Visit: Payer: Self-pay

## 2020-03-02 DIAGNOSIS — Z1389 Encounter for screening for other disorder: Secondary | ICD-10-CM | POA: Diagnosis not present

## 2020-03-02 DIAGNOSIS — Z1329 Encounter for screening for other suspected endocrine disorder: Secondary | ICD-10-CM | POA: Diagnosis not present

## 2020-03-02 DIAGNOSIS — Z13220 Encounter for screening for lipoid disorders: Secondary | ICD-10-CM

## 2020-03-02 DIAGNOSIS — Z0000 Encounter for general adult medical examination without abnormal findings: Secondary | ICD-10-CM

## 2020-03-02 DIAGNOSIS — E785 Hyperlipidemia, unspecified: Secondary | ICD-10-CM

## 2020-03-02 DIAGNOSIS — M25552 Pain in left hip: Secondary | ICD-10-CM

## 2020-03-02 LAB — URINALYSIS, ROUTINE W REFLEX MICROSCOPIC
Bilirubin Urine: NEGATIVE
Hgb urine dipstick: NEGATIVE
Ketones, ur: NEGATIVE
Nitrite: NEGATIVE
Specific Gravity, Urine: 1.02 (ref 1.000–1.030)
Total Protein, Urine: NEGATIVE
Urine Glucose: NEGATIVE
Urobilinogen, UA: 0.2 (ref 0.0–1.0)
pH: 6 (ref 5.0–8.0)

## 2020-03-02 LAB — CBC WITH DIFFERENTIAL/PLATELET
Basophils Absolute: 0 10*3/uL (ref 0.0–0.1)
Basophils Relative: 0.8 % (ref 0.0–3.0)
Eosinophils Absolute: 0.2 10*3/uL (ref 0.0–0.7)
Eosinophils Relative: 3.4 % (ref 0.0–5.0)
HCT: 38.4 % (ref 36.0–46.0)
Hemoglobin: 12.9 g/dL (ref 12.0–15.0)
Lymphocytes Relative: 25.9 % (ref 12.0–46.0)
Lymphs Abs: 1.6 10*3/uL (ref 0.7–4.0)
MCHC: 33.6 g/dL (ref 30.0–36.0)
MCV: 85.9 fl (ref 78.0–100.0)
Monocytes Absolute: 0.4 10*3/uL (ref 0.1–1.0)
Monocytes Relative: 6.6 % (ref 3.0–12.0)
Neutro Abs: 4 10*3/uL (ref 1.4–7.7)
Neutrophils Relative %: 63.3 % (ref 43.0–77.0)
Platelets: 203 10*3/uL (ref 150.0–400.0)
RBC: 4.47 Mil/uL (ref 3.87–5.11)
RDW: 12.4 % (ref 11.5–15.5)
WBC: 6.3 10*3/uL (ref 4.0–10.5)

## 2020-03-02 LAB — COMPREHENSIVE METABOLIC PANEL
ALT: 16 U/L (ref 0–35)
AST: 18 U/L (ref 0–37)
Albumin: 4.5 g/dL (ref 3.5–5.2)
Alkaline Phosphatase: 113 U/L (ref 39–117)
BUN: 17 mg/dL (ref 6–23)
CO2: 26 mEq/L (ref 19–32)
Calcium: 9.5 mg/dL (ref 8.4–10.5)
Chloride: 104 mEq/L (ref 96–112)
Creatinine, Ser: 0.84 mg/dL (ref 0.40–1.20)
GFR: 70.12 mL/min (ref >60.00)
Glucose, Bld: 94 mg/dL (ref 70–99)
Potassium: 4.4 mEq/L (ref 3.5–5.1)
Sodium: 137 mEq/L (ref 135–145)
Total Bilirubin: 0.4 mg/dL (ref 0.2–1.2)
Total Protein: 7.1 g/dL (ref 6.0–8.3)

## 2020-03-02 LAB — LIPID PANEL
Cholesterol: 234 mg/dL — ABNORMAL HIGH (ref 0–200)
HDL: 53 mg/dL (ref >39.00)
NonHDL: 181.3
Total CHOL/HDL Ratio: 4
Triglycerides: 223 mg/dL — ABNORMAL HIGH (ref 0.0–149.0)
VLDL: 44.6 mg/dL — ABNORMAL HIGH (ref 0.0–40.0)

## 2020-03-02 LAB — TSH: TSH: 2.67 u[IU]/mL (ref 0.35–4.50)

## 2020-03-02 LAB — LDL CHOLESTEROL, DIRECT: Direct LDL: 140 mg/dL

## 2020-03-03 ENCOUNTER — Ambulatory Visit
Admission: RE | Admit: 2020-03-03 | Discharge: 2020-03-03 | Disposition: A | Discharge status: Home / Self Care | Payer: BC Managed Care – PPO | Source: Ambulatory Visit | Attending: Internal Medicine | Admitting: Internal Medicine

## 2020-03-03 ENCOUNTER — Telehealth: Payer: Self-pay | Admitting: Internal Medicine

## 2020-03-03 DIAGNOSIS — R519 Headache, unspecified: Secondary | ICD-10-CM

## 2020-03-03 DIAGNOSIS — G43019 Migraine without aura, intractable, without status migrainosus: Secondary | ICD-10-CM | POA: Diagnosis present

## 2020-03-03 NOTE — Telephone Encounter (Signed)
-----   Message from Bevelyn Buckles, MD sent at 03/02/2020  5:55 PM EDT ----- Thyroid labs normal  Cholesterol elevated  -mail cholesterol handout  Rec healthy diet and exercise   Liver kidneys normal  Blood cts normal  Some mucous in urine any vaginal itching/irritation?

## 2020-03-03 NOTE — Telephone Encounter (Signed)
Left message to return call 

## 2020-03-06 NOTE — Addendum Note (Signed)
Addended by: Quentin Ore on: 03/06/2020 12:17 PM   Modules accepted: Orders

## 2020-03-12 ENCOUNTER — Encounter: Payer: Self-pay | Admitting: Internal Medicine

## 2020-03-12 DIAGNOSIS — M7062 Trochanteric bursitis, left hip: Secondary | ICD-10-CM | POA: Insufficient documentation

## 2020-05-04 ENCOUNTER — Telehealth: Payer: Self-pay | Admitting: Internal Medicine

## 2020-05-04 NOTE — Telephone Encounter (Signed)
lFt msg on vm regarding diagnostic mammo on 09/28 at 10:40 am at [facility].

## 2020-05-05 ENCOUNTER — Encounter: Payer: Self-pay | Admitting: Internal Medicine

## 2020-05-22 ENCOUNTER — Encounter: Payer: Self-pay | Admitting: Internal Medicine

## 2020-05-22 ENCOUNTER — Telehealth: Payer: Self-pay | Admitting: Internal Medicine

## 2020-05-22 NOTE — Telephone Encounter (Signed)
Response sent via initial mychart message encounter

## 2020-05-22 NOTE — Telephone Encounter (Signed)
Please advise    Routing comment   [name], [name]  You 2 weeks ago   Is there anyway you could write a note for me?   The neurologist has put me on nortriptyline and you know me and my stomach. I have had a little bit of an issue with dizziness and sadness. Nothing major but the school wants a note for yesterday ,Sept 9th and today, Sept 10 because I asked off.  Please let me know if this is possible.  Thanks for your time, Brooke Chambers    Please advise note in my chart  If further notes needed and its due for a reason from outside provider and she did not see me please get note from them I.e neurology   Does she want referral to therapy for sadness?

## 2020-06-05 ENCOUNTER — Other Ambulatory Visit: Payer: Self-pay

## 2020-06-05 ENCOUNTER — Ambulatory Visit
Admission: RE | Admit: 2020-06-05 | Discharge: 2020-06-05 | Disposition: A | Discharge status: Home / Self Care | Payer: BC Managed Care – PPO | Source: Ambulatory Visit | Attending: Internal Medicine | Admitting: Internal Medicine

## 2020-06-05 DIAGNOSIS — R921 Mammographic calcification found on diagnostic imaging of breast: Secondary | ICD-10-CM | POA: Insufficient documentation

## 2020-07-13 IMAGING — MR MR HEAD W/O CM
12 series · 47 of 48 positions shown · non-contrast
Comparison: Head CT 03/22/2008

CLINICAL DATA: Headaches over the last year. Normal neurological
exam.

EXAM:
MRI HEAD WITHOUT CONTRAST
TECHNIQUE: Multiplanar, multiecho pulse sequences of the brain and surrounding
structures were obtained without intravenous contrast.

[Series 5: ax dwi_tracew · axial · 3.0mm · 0.60mm/px · z∈[-122,+26]mm · 3 of 46 slices shown]
[im 1/46]
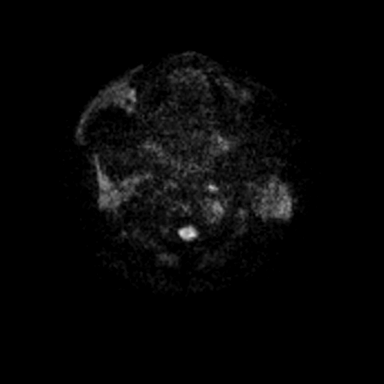
[im 23/46]
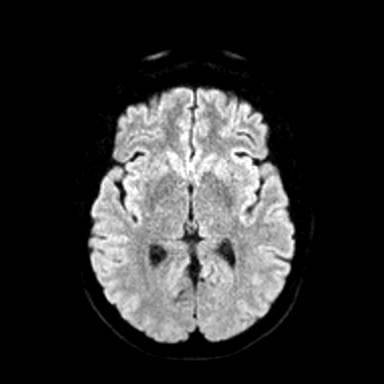
[im 46/46]
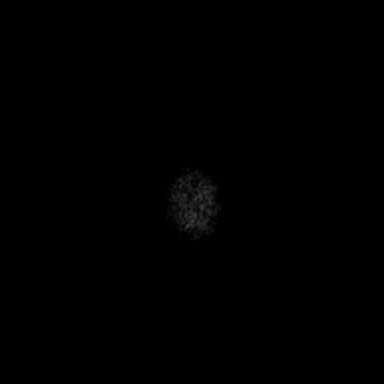

[Series 6: ax dwi_adc · axial · 3.0mm · 0.60mm/px · z∈[-122,+26]mm · 3 of 46 slices shown]
[im 1/46]
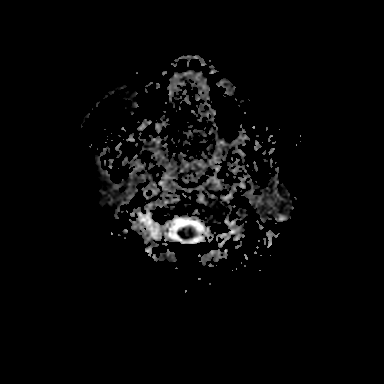
[im 23/46]
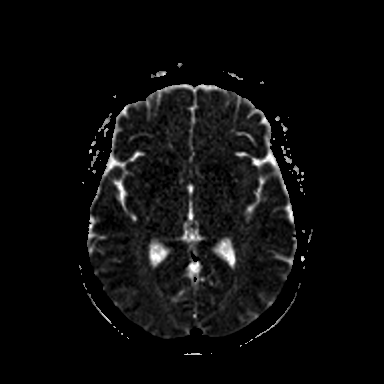
[im 46/46]
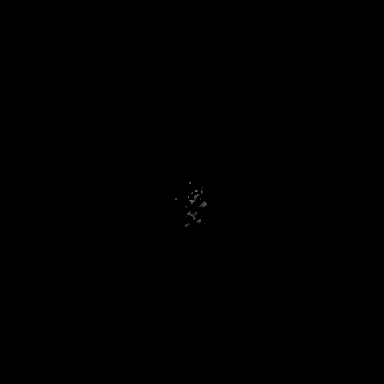

[Series 7: cor dwi_tracew · coronal · 5.0mm · 0.60mm/px · 3 of 34 slices shown]
[im 1/34]
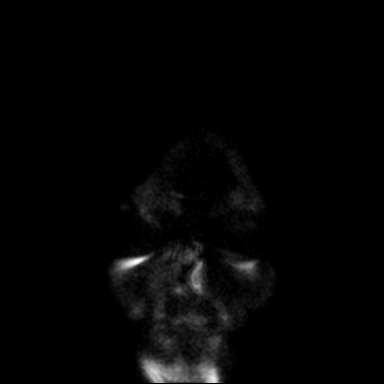
[im 17/34]
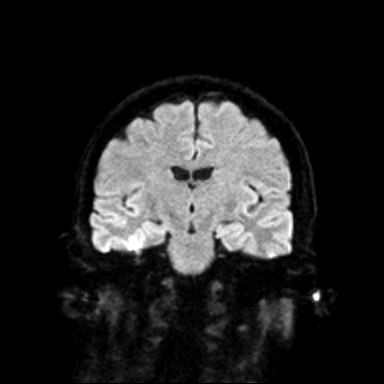
[im 34/34]
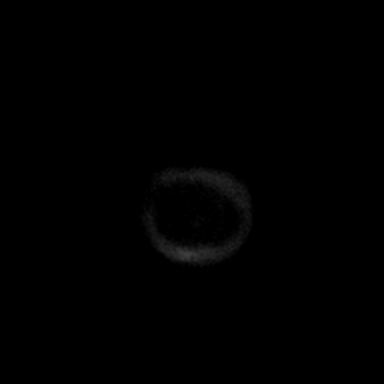

[Series 8: cor dwi_adc · coronal · 5.0mm · 0.60mm/px · 3 of 34 slices shown]
[im 1/34]
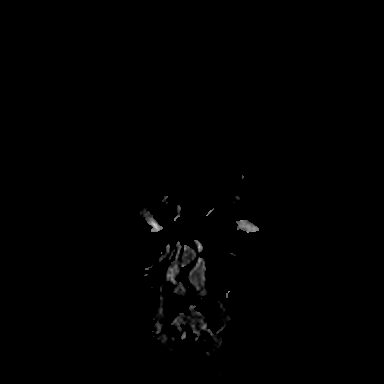
[im 17/34]
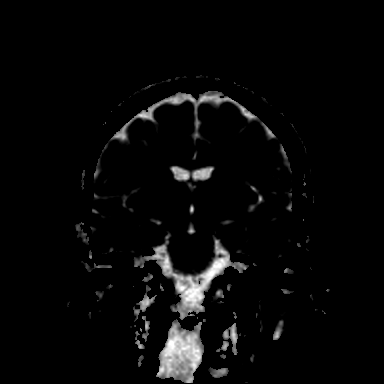
[im 34/34]
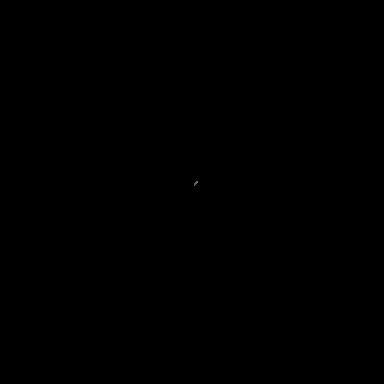

[Series 9: T1 · sagittal · 5.0mm · 0.62mm/px · 2 of 20 slices shown (1 of 2)]
[im 1/20]
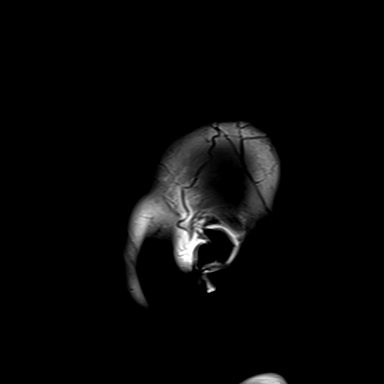
[im 20/20]
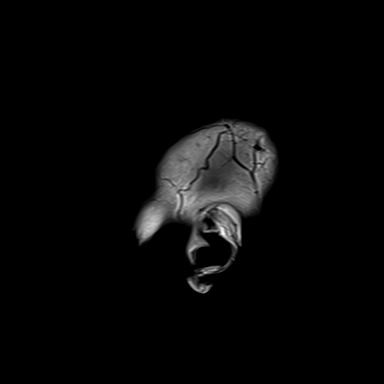

[Series 10: T2 · axial · 5.0mm · 0.53mm/px · z∈[-120,+23]mm · 2 of 25 slices shown (1 of 2)]
[im 1/25]
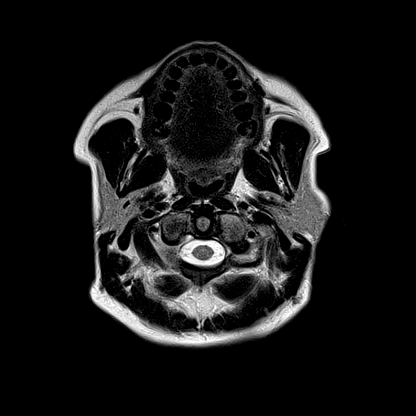
[im 25/25]
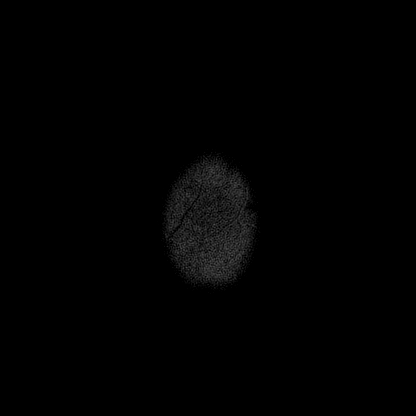

[Series 11: mag_images · axial · 3.0mm · 0.90mm/px · z∈[-135,+41]mm · 5 of 60 slices shown]
[im 1/60]
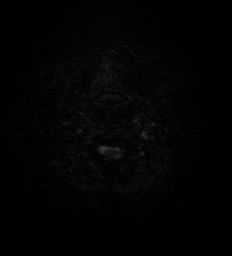
[im 15/60]
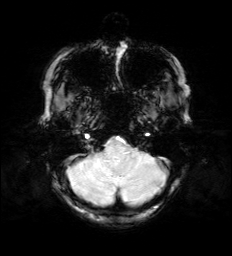
[im 30/60]
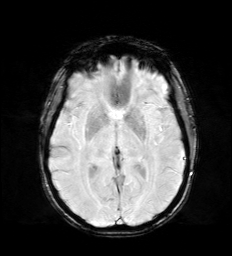
[im 45/60]
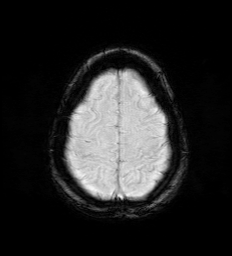
[im 60/60]
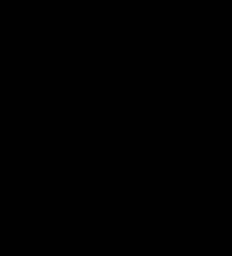

[Series 12: pha_images · axial · 3.0mm · 0.90mm/px · z∈[-135,+38]mm · 5 of 59 slices shown]
[im 1/59]
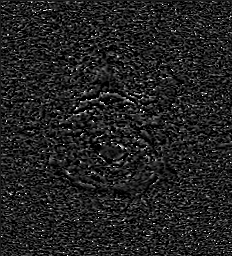
[im 15/59]
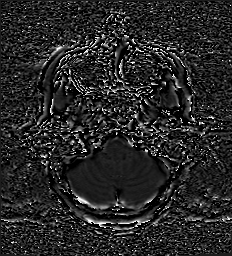
[im 30/59]
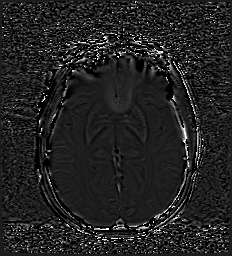
[im 44/59]
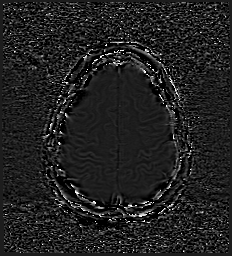
[im 59/59]
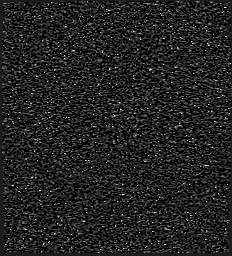

[Series 13: swi_images · axial · 3.0mm · 0.90mm/px · z∈[-135,+41]mm · 5 of 60 slices shown]
[im 1/60]
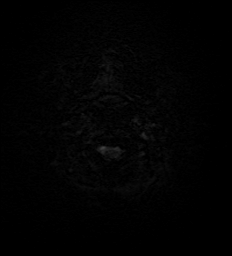
[im 15/60]
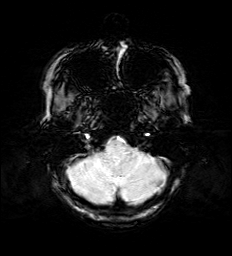
[im 30/60]
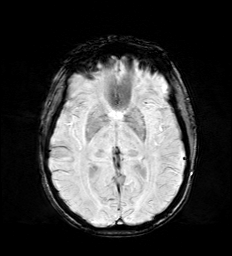
[im 45/60]
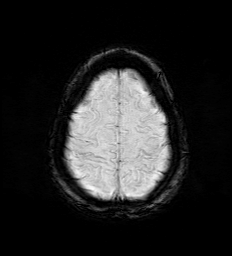
[im 60/60]
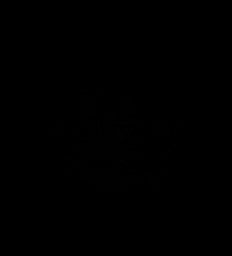

[Series 15: FLAIR · axial · 3.0mm · 0.53mm/px · z∈[-129,+32]mm · 4 of 55 slices shown]
[im 1/55]
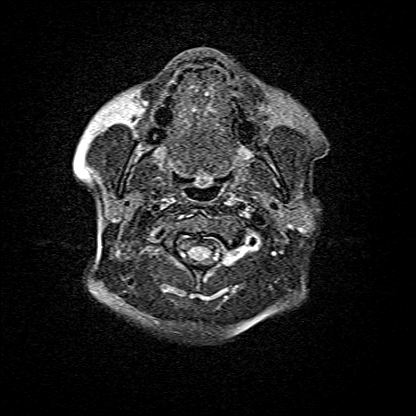
[im 19/55]
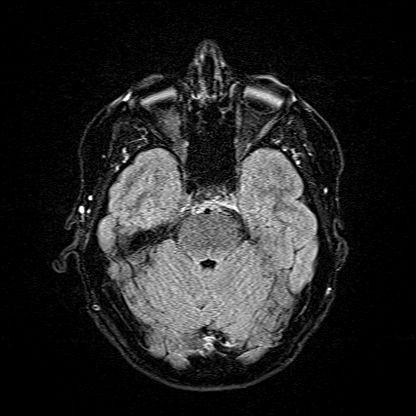
[im 37/55]
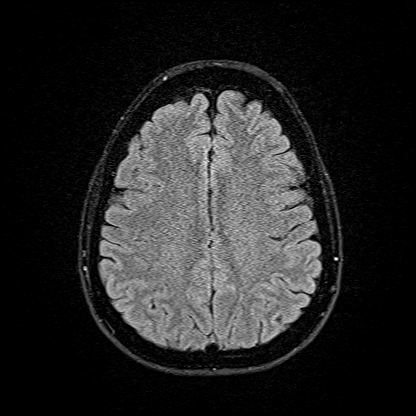
[im 55/55]
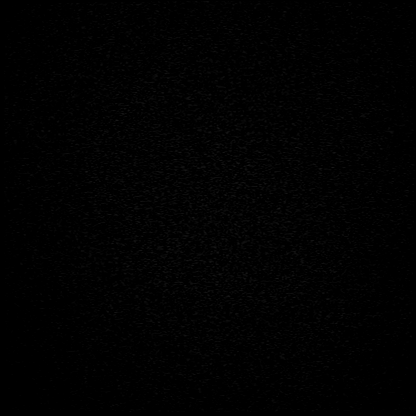

[Series 16: T1 · axial · 1.0mm · 0.98mm/px · z∈[-119,+24]mm · 10 of 144 slices shown (2 of 2)]
[im 1/144]
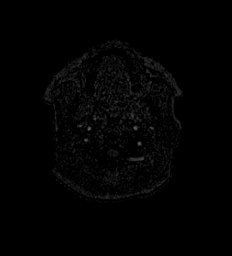
[im 15/144]
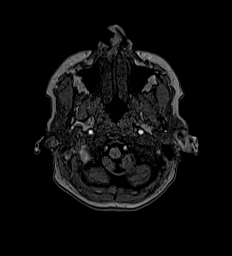
[im 29/144]
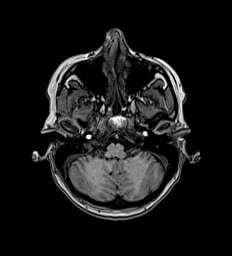
[im 43/144]
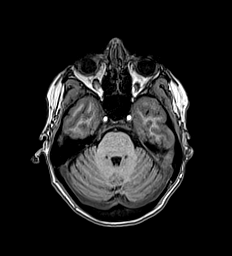
[im 58/144]
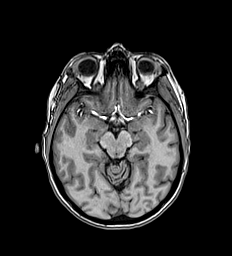
[im 72/144]
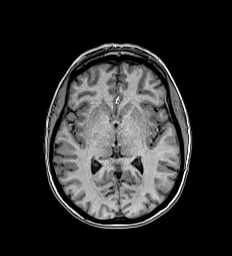
[im 86/144]
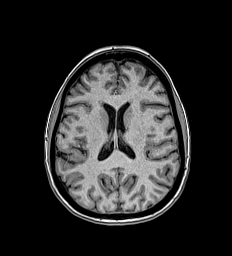
[im 101/144]
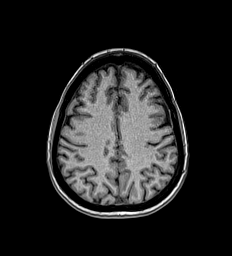
[im 115/144]
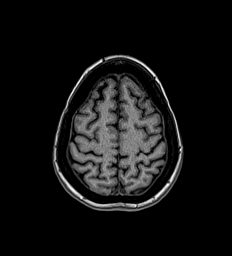
[im 144/144]
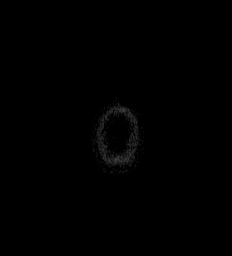

[Series 17: T2 · coronal · 5.0mm · 0.57mm/px · 2 of 26 slices shown (2 of 2)]
[im 1/26]
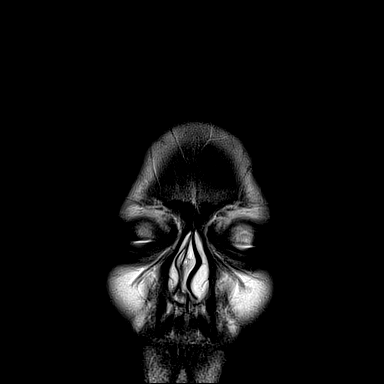
[im 26/26]
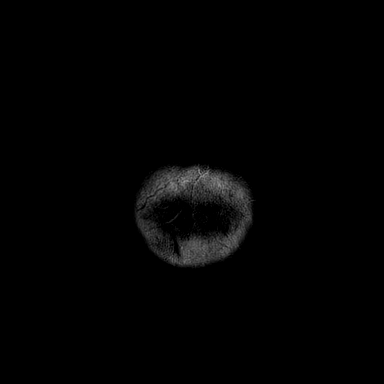

[47 of 48 positions shown; findings below may reference images not displayed]

FINDINGS: Brain: The brain has a normal appearance without evidence of
malformation, atrophy, old or acute small or large vessel
infarction, mass lesion, hemorrhage, hydrocephalus or extra-axial
collection.

Vascular: Major vessels at the base of the brain show flow. Venous
sinuses appear patent.

Skull and upper cervical spine: Normal.

Sinuses/Orbits: Clear/normal.

Other: None significant.
IMPRESSION: Normal examination.  No abnormality seen to explain headache.

## 2020-08-15 ENCOUNTER — Ambulatory Visit: Payer: BC Managed Care – PPO | Admitting: Internal Medicine

## 2020-09-27 ENCOUNTER — Telehealth: Payer: Self-pay | Admitting: Family Medicine

## 2020-10-01 NOTE — Progress Notes (Signed)
Virtual appointment canceled.

## 2020-10-13 ENCOUNTER — Encounter: Payer: BC Managed Care – PPO | Admitting: Internal Medicine

## 2021-07-18 ENCOUNTER — Other Ambulatory Visit: Payer: Self-pay

## 2021-07-18 ENCOUNTER — Ambulatory Visit: Payer: BC Managed Care – PPO | Admitting: Family

## 2021-07-18 ENCOUNTER — Encounter: Payer: Self-pay | Admitting: Family

## 2021-07-18 VITALS — BP 108/70 | HR 70 | Temp 98.0°F | Resp 16 | Ht 63.0 in | Wt 135.5 lb

## 2021-07-18 DIAGNOSIS — L259 Unspecified contact dermatitis, unspecified cause: Secondary | ICD-10-CM

## 2021-07-18 MED ORDER — PREDNISONE 20 MG PO TABS: 40.0000 mg | ORAL_TABLET | Freq: Every day | ORAL | 0 refills | Status: DC

## 2021-07-22 ENCOUNTER — Encounter: Payer: Self-pay | Admitting: Family

## 2021-07-22 NOTE — Progress Notes (Signed)
Acute Office Visit  Subjective:    Patient ID: Brooke Chambers, female    DOB: 02/29/1966, 55 y.o.   MRN: [id number]  Chief Complaint  Patient presents with  . Rash    Toi back and sides of abdomen  X 4 weeks itch    HPI Patient is in today with c/o an itchy rash x 4 weeks on the torso. She reports starting collagen but stopped and the rash persist. No changes in detergents, soaps, or lotions. Reports starting a new brand of a trail mix. Normally eats trail mix daily. No SOB.  Past Medical History:  Diagnosis Date  . 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 Bonney Aid   . Allergy   . Anxiety   . Arthritis   . Chicken pox   . Depression   . Gastric ulcer    2/2 NSAIDS  . GERD (gastroesophageal reflux disease)   . Headache   . HPV in female   . Hyperlipidemia   . Kidney stone   . Migraine     Past Surgical History:  Procedure Laterality Date  . COLONOSCOPY WITH PROPOFOL N/A 02/14/2015   Procedure: COLONOSCOPY WITH PROPOFOL;  Surgeon: Earline Mayotte, MD;  Location[facility] ENDOSCOPY;  Service: Endoscopy;  Laterality: N/A;  . ectocervix biopsy     01/2018 LSIL/CIN 1 Dr. Bonney Aid   . HERNIA REPAIR Left   . TONSILLECTOMY     1985  . UPPER GI ENDOSCOPY      Family History  Problem Relation Age of Onset  . Arthritis Mother   . Hearing loss Mother   . Hypertension Mother   . Arthritis Father   . Hearing loss Father   . Hyperlipidemia Father   . Hypertension Father   . Arthritis Maternal Grandmother   . Alcohol abuse Maternal Grandfather   . Arthritis Maternal Grandfather   . Kidney disease Maternal Grandfather   . Arthritis Paternal Grandmother   . Cancer Paternal Grandmother        breast  . Arthritis Paternal Grandfather   . Heart disease Paternal Grandfather   . Hyperlipidemia Paternal Grandfather   . Hypertension Paternal Grandfather   . Breast cancer Neg Hx     Social History   Socioeconomic History  . Marital status: Married    Spouse name:  Not on file  . Number of children: Not on file  . Years of education: Not on file  . Highest education level: Not on file  Occupational History  . Not on file  Tobacco Use  . Smoking status: Former    Years: 7.00    Types: Cigarettes    Quit date: 08/26/1992    Years since quitting: 28.9  . Smokeless tobacco: Never  Vaping Use  . Vaping Use: Never used  Substance and Sexual Activity  . Alcohol use: Yes    Alcohol/week: 0.0 standard drinks    Comment: occasionally  . Drug use: No  . Sexual activity: Yes    Birth control/protection: None  Other Topics Concern  . 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 OVER THE COUNTER MEDICATION Doterra vitamins     No facility-administered medications prior to visit.    Allergies  Allergen Reactions  . Aleve [Naproxen] Swelling    Review of Systems  Skin:  Positive for rash.  All other systems reviewed and are negative.     Objective:    Physical Exam Vitals and nursing note reviewed.  Constitutional:      Appearance: Normal appearance.  Cardiovascular:     Rate and Rhythm: Normal rate and regular rhythm.  Pulmonary:     Effort: Pulmonary effort is normal.     Breath sounds: Normal breath sounds.  Musculoskeletal:        General: Normal range of motion.  Skin:    Findings: Rash present.     Comments: Dry, excoriated rash noted to the back and torso.   Neurological:     General: No focal deficit present.      Mental Status: She is alert and oriented to person, place, and time.  Psychiatric:        Mood and Affect: Mood normal.        Behavior: Behavior normal.   BP 108/70   Pulse 70   Temp 98 F (36.7 C) (Oral)   Resp 16   Ht 5\' 3"  (1.6 m)   Wt 135 lb 8 oz (61.5 kg)   LMP 02/25/2018   SpO2 99%   BMI 24.00 kg/m  Wt Readings from Last 3 Encounters:  07/18/21 135 lb 8 oz (61.5 kg)  02/08/20 152 lb 12.8 oz (69.3 kg)  05/11/19 142 lb 12.8 oz (64.8 kg)    Health Maintenance Due  Topic Date Due  . COVID-19 Vaccine (1) Never done  . Pneumococcal Vaccine 73-88 Years old (1 - PCV) Never done  . HIV Screening  Never done  . Zoster Vaccines- Shingrix (1 of 2) Never done  . PAP SMEAR-Modifier  01/15/2021  . INFLUENZA VACCINE  Never done    There are no preventive care reminders to display for this patient.   Lab Results  Component Value Date   TSH 2.67 03/02/2020   Lab Results  Component Value Date   WBC 6.3 03/02/2020   HGB 12.9 03/02/2020   HCT 38.4 03/02/2020   MCV 85.9 03/02/2020   PLT 203.0 03/02/2020   Lab Results  Component Value Date   NA 137 03/02/2020   K 4.4 03/02/2020   CO2 26 03/02/2020   GLUCOSE 94 03/02/2020   BUN 17 03/02/2020   CREATININE 0.84 03/02/2020   BILITOT 0.4 03/02/2020   ALKPHOS 113 03/02/2020   AST 18 03/02/2020   ALT 16 03/02/2020   PROT 7.1 03/02/2020   ALBUMIN 4.5 03/02/2020   CALCIUM 9.5 03/02/2020   GFR 70.12 03/02/2020   Lab Results  Component Value Date   CHOL 234 (H) 03/02/2020   Lab Results  Component Value Date   HDL 53.00 03/02/2020   Lab Results  Component Value Date   LDLCALC 131 (H) 03/04/2019   Lab Results  Component Value Date   TRIG 223.0 (H) 03/02/2020   Lab Results  Component Value Date   CHOLHDL 4 03/02/2020   No results found for: HGBA1C     Assessment & Plan:   Problem List  Items Addressed This Visit   None Visit Diagnoses     Contact dermatitis, unspecified contact dermatitis type,  unspecified trigger    -  Primary        Meds ordered this encounter  Medications  . predniSONE (DELTASONE) 20 MG tablet    Sig: Take 2 tablets (40 mg total) by mouth daily with breakfast.    Dispense:  10 tablet    Refill:  0   Call the office if symptoms worsen or persist. Consider the new trail mix as a possible culprit. Recheck as scheduled and sooner as needed.   Eulis Foster, FNP

## 2021-07-25 ENCOUNTER — Telehealth: Payer: Self-pay | Admitting: Internal Medicine

## 2021-07-25 NOTE — Telephone Encounter (Signed)
Pt was seen at [facility] by Hyman Hopes 11/23 10:15 for an allergic reaction (rash and itching) which she was prescribed prednisone for 5 days. Pt states she finished her last dose on Sunday. She was off the prednisone all [place] Monday then Tuesday started reacting again with a rash and itchiness.

## 2021-07-26 NOTE — Telephone Encounter (Signed)
Please advise does Patient need to be re-evaluated or okay to send in more medication?

## 2021-07-27 ENCOUNTER — Encounter: Payer: Self-pay | Admitting: Internal Medicine

## 2021-07-27 MED ORDER — HYDROXYZINE HCL 25 MG PO TABS: 25.0000 mg | ORAL_TABLET | Freq: Three times a day (TID) | ORAL | 0 refills | Status: DC | PRN

## 2021-07-27 MED ORDER — TRIAMCINOLONE ACETONIDE 0.1 % EX CREA: 1.0000 "application " | TOPICAL_CREAM | Freq: Two times a day (BID) | CUTANEOUS | 0 refills | Status: AC

## 2021-07-27 MED ORDER — PREDNISONE 20 MG PO TABS: 40.0000 mg | ORAL_TABLET | Freq: Every day | ORAL | 0 refills | Status: DC

## 2021-07-27 NOTE — Telephone Encounter (Signed)
TYPO*  Pt calling in regards to previous message! Pt states rash has spread and it is a lot worse. Pt was sent to access nurse

## 2021-07-27 NOTE — Telephone Encounter (Signed)
Pt calling in regards to previous message! Pt states rash has not spread and it is a lot worse. Pt was sent to access nurse

## 2021-07-27 NOTE — Telephone Encounter (Signed)
Call pt Monday and if needs appt sch appt in person with me to see the rash  If  worse rec urgent care [facility] or ED

## 2021-07-27 NOTE — Addendum Note (Signed)
Addended by: Quentin Ore on: 07/27/2021 06:31 PM   Modules accepted: Orders

## 2021-07-30 NOTE — Telephone Encounter (Signed)
Left message to return call on mobile No answer, no voicemail on home number  Please schedule Patient for appointment if calling back in. If no appointments available same [place] or the next [place] Patient will need to go be seen by urgent care.

## 2021-08-01 NOTE — Telephone Encounter (Signed)
Left message to return call. Mychart message from provider has also been sent but has not been read by Patient

## 2021-08-22 ENCOUNTER — Other Ambulatory Visit (HOSPITAL_COMMUNITY)
Admission: RE | Admit: 2021-08-22 | Discharge: 2021-08-22 | Disposition: A | Discharge status: Home / Self Care | Payer: BC Managed Care – PPO | Source: Ambulatory Visit | Attending: Internal Medicine | Admitting: Internal Medicine

## 2021-08-22 ENCOUNTER — Ambulatory Visit (INDEPENDENT_AMBULATORY_CARE_PROVIDER_SITE_OTHER): Payer: BC Managed Care – PPO | Admitting: Internal Medicine

## 2021-08-22 ENCOUNTER — Other Ambulatory Visit: Payer: Self-pay

## 2021-08-22 ENCOUNTER — Encounter: Payer: Self-pay | Admitting: Internal Medicine

## 2021-08-22 VITALS — BP 104/66 | HR 70 | Temp 96.9°F | Ht 63.15 in | Wt 141.6 lb

## 2021-08-22 DIAGNOSIS — R21 Rash and other nonspecific skin eruption: Secondary | ICD-10-CM | POA: Diagnosis not present

## 2021-08-22 DIAGNOSIS — Z124 Encounter for screening for malignant neoplasm of cervix: Secondary | ICD-10-CM | POA: Insufficient documentation

## 2021-08-22 DIAGNOSIS — Z0000 Encounter for general adult medical examination without abnormal findings: Secondary | ICD-10-CM | POA: Diagnosis not present

## 2021-08-22 DIAGNOSIS — Z1389 Encounter for screening for other disorder: Secondary | ICD-10-CM

## 2021-08-22 DIAGNOSIS — E785 Hyperlipidemia, unspecified: Secondary | ICD-10-CM | POA: Diagnosis not present

## 2021-08-22 DIAGNOSIS — L509 Urticaria, unspecified: Secondary | ICD-10-CM

## 2021-08-22 DIAGNOSIS — Z1231 Encounter for screening mammogram for malignant neoplasm of breast: Secondary | ICD-10-CM

## 2021-08-22 DIAGNOSIS — E559 Vitamin D deficiency, unspecified: Secondary | ICD-10-CM

## 2021-08-22 DIAGNOSIS — R748 Abnormal levels of other serum enzymes: Secondary | ICD-10-CM

## 2021-08-22 DIAGNOSIS — L259 Unspecified contact dermatitis, unspecified cause: Secondary | ICD-10-CM

## 2021-08-22 DIAGNOSIS — Z1329 Encounter for screening for other suspected endocrine disorder: Secondary | ICD-10-CM

## 2021-08-22 DIAGNOSIS — E538 Deficiency of other specified B group vitamins: Secondary | ICD-10-CM | POA: Diagnosis not present

## 2021-08-22 LAB — LIPID PANEL
Cholesterol: 289 mg/dL — ABNORMAL HIGH (ref 0–200)
HDL: 64.4 mg/dL (ref >39.00)
NonHDL: 224.19
Total CHOL/HDL Ratio: 4
Triglycerides: 262 mg/dL — ABNORMAL HIGH (ref 0.0–149.0)
VLDL: 52.4 mg/dL — ABNORMAL HIGH (ref 0.0–40.0)

## 2021-08-22 LAB — CBC WITH DIFFERENTIAL/PLATELET
Basophils Absolute: 0 10*3/uL (ref 0.0–0.1)
Basophils Relative: 0.6 % (ref 0.0–3.0)
Eosinophils Absolute: 0.2 10*3/uL (ref 0.0–0.7)
Eosinophils Relative: 2.8 % (ref 0.0–5.0)
HCT: 38.4 % (ref 36.0–46.0)
Hemoglobin: 12.8 g/dL (ref 12.0–15.0)
Lymphocytes Relative: 21.8 % (ref 12.0–46.0)
Lymphs Abs: 1.6 10*3/uL (ref 0.7–4.0)
MCHC: 33.4 g/dL (ref 30.0–36.0)
MCV: 85.4 fl (ref 78.0–100.0)
Monocytes Absolute: 0.5 10*3/uL (ref 0.1–1.0)
Monocytes Relative: 6.3 % (ref 3.0–12.0)
Neutro Abs: 5.1 10*3/uL (ref 1.4–7.7)
Neutrophils Relative %: 68.5 % (ref 43.0–77.0)
Platelets: 241 10*3/uL (ref 150.0–400.0)
RBC: 4.5 Mil/uL (ref 3.87–5.11)
RDW: 12.1 % (ref 11.5–15.5)
WBC: 7.4 10*3/uL (ref 4.0–10.5)

## 2021-08-22 LAB — LDL CHOLESTEROL, DIRECT: Direct LDL: 204 mg/dL

## 2021-08-22 LAB — COMPREHENSIVE METABOLIC PANEL
ALT: 62 U/L — ABNORMAL HIGH (ref 0–35)
AST: 51 U/L — ABNORMAL HIGH (ref 0–37)
Albumin: 4.4 g/dL (ref 3.5–5.2)
Alkaline Phosphatase: 119 U/L — ABNORMAL HIGH (ref 39–117)
BUN: 17 mg/dL (ref 6–23)
CO2: 29 mEq/L (ref 19–32)
Calcium: 9.6 mg/dL (ref 8.4–10.5)
Chloride: 102 mEq/L (ref 96–112)
Creatinine, Ser: 0.85 mg/dL (ref 0.40–1.20)
GFR: 75.98 mL/min (ref >60.00)
Glucose, Bld: 83 mg/dL (ref 70–99)
Potassium: 4.8 mEq/L (ref 3.5–5.1)
Sodium: 138 mEq/L (ref 135–145)
Total Bilirubin: 0.4 mg/dL (ref 0.2–1.2)
Total Protein: 7 g/dL (ref 6.0–8.3)

## 2021-08-22 LAB — TSH: TSH: 2.43 u[IU]/mL (ref 0.35–5.50)

## 2021-08-22 LAB — VITAMIN B12: Vitamin B-12: 663 pg/mL (ref 211–911)

## 2021-08-22 LAB — VITAMIN D 25 HYDROXY (VIT D DEFICIENCY, FRACTURES): VITD: 20.43 ng/mL — ABNORMAL LOW (ref 30.00–100.00)

## 2021-08-22 MED ORDER — HYDROXYZINE HCL 25 MG PO TABS: 25.0000 mg | ORAL_TABLET | Freq: Three times a day (TID) | ORAL | 0 refills | Status: AC | PRN

## 2021-08-22 NOTE — Progress Notes (Signed)
Chief Complaint  Patient presents with   Annual Exam   Gynecologic Exam   Rash    Reoccurring rash up the spine and back of the head. Patient thinks she may be allergic to something but does not know what. When taking Prednisone the rash will go away but comes back as soon as she finishes the medication. Would like allergy testing done.    Referral    Allergy testing    Annual  1. Pap due h/o lsil in 01/2018 Dr. Bonney Aid and had bx no f/u since had US pelvic 02/18/18 and normal  2. Itchy Rash up spine to neck and back of head, flank, arms x 1 month been through 2 rounds of steroids but rash returns no new products  Consider allergist if dermatology cant help figure this out  Atarax helps prn with itching but makes her sleepy  Review of Systems  Constitutional:  Negative for weight loss.  HENT:  Negative for hearing loss.   Eyes:  Negative for blurred vision.  Respiratory:  Negative for shortness of breath.   Cardiovascular:  Negative for chest pain.  Gastrointestinal:  Negative for abdominal pain and blood in stool.  Genitourinary:  Negative for dysuria.  Musculoskeletal:  Negative for falls and joint pain.  Skin:  Positive for itching and rash.  Neurological:  Negative for headaches.  Psychiatric/Behavioral:  Negative for depression.   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 Bonney Aid    Allergy    Anxiety    Arthritis    Chicken pox    COVID-19    08/2018   Depression    Gastric ulcer    2/2 NSAIDS   GERD (gastroesophageal reflux disease)    Headache    HPV in female    Hyperlipidemia    Kidney stone    Migraine    Past Surgical History:  Procedure Laterality Date   COLONOSCOPY WITH PROPOFOL N/A 02/14/2015   Procedure: COLONOSCOPY WITH PROPOFOL;  Surgeon: Earline Mayotte, MD;  Location: [facility] END[facility]COPY;  Service: Endoscopy;  Laterality: N/A;   ectocervix biopsy     01/2018 LSIL/CIN 1 Dr. Bonney Aid    HERNIA REPAIR  Left    TONSILLECTOMY     1985   UPPER GI ENDOSCOPY     Family History  Problem Relation Age of Onset   Arthritis Mother    Hearing loss Mother    Hypertension Mother    Arthritis Father    Hearing loss Father    Hyperlipidemia Father    Hypertension Father    Memory loss Father    Arthritis Maternal Grandmother    Alcohol abuse Maternal Grandfather    Arthritis Maternal Grandfather    Kidney disease Maternal Grandfather    Arthritis Paternal Grandmother    Cancer Paternal Grandmother        breast   Arthritis Paternal Grandfather    Heart disease Paternal Grandfather    Hyperlipidemia Paternal Grandfather    Hypertension Paternal Grandfather    Breast cancer Neg Hx    Social History   Socioeconomic History   Marital status: Married    Spouse name: Not on file   Number of children: Not on file   Years of education: Not on file   Highest education level: Not on file  Occupational History   Not on file  Tobacco Use   Smoking status: Former    Years: 7.00  Types: Cigarettes    Quit date: 08/26/1992    Years since quitting: 29.0   Smokeless tobacco: Never  Vaping Use   Vaping Use: Never used  Substance and Sexual Activity   Alcohol use: Yes    Alcohol/week: 0.0 standard drinks    Comment: occasionally   Drug use: No   Sexual activity: Yes    Birth control/protection: None  Other Topics Concern   Not on file  Social History Narrative   Married    3 kids    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 Prn itching rash do not apply to face, under arms   Allergies  Allergen Reactions   Aleve [Naproxen] Swelling   No results found for this or any previous visit (from the past 2160 hour(s)). Objective  Body mass index is 24.96 kg/m. Wt Readings from Last 3 Encounters:  08/22/21 141 lb 9.6 oz (64.2 kg)  07/18/21 135 lb 8 oz (61.5 kg)  02/08/20 152 lb 12.8 oz (69.3 kg)   Temp Readings from Last 3 Encounters:  08/22/21 (!) 96.9 F (36.1 C) (Temporal)  07/18/21 98 F (36.7 C) (Oral)  02/08/20 (!) 97 F (36.1 C) (Temporal)   BP Readings from Last 3 Encounters:  08/22/21 104/66  07/18/21 108/70  02/08/20 90/64   Pulse Readings from Last 3 Encounters:  08/22/21 70  07/18/21 70  02/08/20 75    Physical Exam Vitals and nursing note reviewed.  Constitutional:      Appearance: Normal appearance. She is well-developed and well-groomed.  HENT:     Head: Normocephalic and atraumatic.  Eyes:     Conjunctiva/sclera: Conjunctivae normal.     Pupils: Pupils are equal, round, and reactive to light.  Cardiovascular:     Rate and Rhythm: Normal rate and regular rhythm.     Heart sounds: Normal heart sounds. No murmur heard. Pulmonary:     Effort: Pulmonary effort is normal.     Breath sounds: Normal breath sounds.  Chest:     Chest wall: No mass.  Breasts:    Breasts are symmetrical.     Right: Normal.     Left: Normal.  Abdominal:     General: Abdomen is flat. Bowel sounds are normal.     Tenderness: There is no abdominal tenderness.  Genitourinary:    Pubic Area: No rash.      Labia:        Right: No rash.        Left: No rash.      Vagina: Normal.     Uterus: Normal.      Adnexa: Right adnexa normal and left adnexa normal.     Comments: Cervical os closed with h/o LSIL  Musculoskeletal:        General: No tenderness.  Lymphadenopathy:     Upper Body:     Right upper body: No axillary adenopathy.     Left upper body:  No  axillary adenopathy.  Skin:    General: Skin is warm and dry.  Neurological:     General: No focal deficit present.     Mental Status: She is alert and oriented to person, place, and time. Mental status is at baseline.     Cranial Nerves: Cranial nerves 2-12 are intact.     Sensory: Sensation is intact.     Motor: Motor function is intact.     Coordination: Coordination is intact.     Gait: Gait is intact.  Psychiatric:        Attention and Perception: Attention and perception normal.        Mood and Affect: Mood and affect normal.        Speech: Speech normal.        Behavior: Behavior normal. Behavior is cooperative.        Thought Content: Thought content normal.        Cognition and Memory: Cognition and memory normal.        Judgment: Judgment normal.    Assessment  Plan  Annual physical exam - Plan: Comprehensive metabolic panel, Lipid panel, CBC w/Diff, TSH, Vitamin D (25 hydroxy) See below  Rash - Plan: Ambulatory referral to Dermatology, hydrOXYzine (ATARAX) 25 MG tablet Hives - Plan: Ambulatory referral to Dermatology, hydrOXYzine (ATARAX) 25 MG tablet Consider allergy if does not go away Contact dermatitis, unspecified contact dermatitis type, unspecified trigger - Plan: hydrOXYzine (ATARAX) 25 MG tablet  Hm/annual Reviewed labs 03/04/19 +HLD Declines flu shot, prevnar, shingles vaccines Tdap utd  Hep B rec given info to read about prev disc not immune Declines covid 19 shot    Colonoscopy per pt had 02/14/15 repeat due in 10 years 2026 Dr. Lemar Livings  Former smoker quit 1990 smoked 5-7 years max 0.5 ppd   Pap 01/15/18 neg pap neg HPV - OB/GYN Dr. Bonney Aid h/o abnormal pap and LN2 cervix in HS -LSIL/CIN 1 noted on ectocervix bx 02/10/18 due to f/u in 07/2018  Pap 02/10/18 CIN 1 and [place] 02/18/18 normal  Cervical os closed today not sure if endocervix lesion resolved after bx 02/10/18 will CC Dr. Bonney Aid  Pap today    03/02/18 left breast calcifications sch dx mammogram  09/03/2018 b/l left breast calcification  05/2020 last mammogram due now referred   rec healthy diet choices and exercise    Derm seen in 2020 skin doing better [address]  Referred today for rash back/flank, arms, neck  Provider: Dr. French Ana McLean-Scocuzza-[facility]

## 2021-08-22 NOTE — Patient Instructions (Addendum)
Consider PATCH test with dermatology or they may biopsy rash  Hives Hives (urticaria) are itchy, red, swollen areas on the skin. Hives can appear on any part of the body. Hives often fade within 24 hours (acute hives). Sometimes, new hives appear after old ones fade and the cycle can continue for several days or weeks (chronic hives). Hives do not spread from person to person (are not contagious). Hives come from the body's reaction to something a person is allergic to (allergen), something that causes irritation, or various other triggers. When a person is exposed to a trigger, his or her body releases a chemical (histamine) that causes redness, itching, and swelling. Hives can appear right after exposure to a trigger or hours later. What are the causes? This condition may be caused by: Allergies to foods or ingredients. Insect bites or stings. Exposure to pollen or pets. Spending time in sunlight, heat, or cold (exposure). Exercise. Stress. You can also get hives from other medical conditions and treatments, such as: Viruses, including the common cold. Bacterial infections, such as urinary tract infections and strep throat. Certain medicines. Contact with latex or chemicals. Allergy shots. Blood transfusions. Sometimes, the cause of this condition is not known (idiopathic hives). What increases the risk? You are more likely to develop this condition if you: Are a woman. Have food allergies, especially to citrus fruits, milk, eggs, peanuts, tree nuts, or shellfish. Are allergic to: Medicines. Latex. Insects. Animals. Pollen. What are the signs or symptoms? Common symptoms of this condition include raised, itchy, red or white bumps or patches on your skin. These areas may: Become large and swollen (welts). Change in shape and location, quickly and repeatedly. Be separate hives or connect over a large area of skin. Sting or become painful. Turn white when pressed in the center  (blanch). In severe cases, your hands, feet, and face may also become swollen. This may occur if hives develop deeper in your skin. How is this diagnosed? This condition may be diagnosed by your symptoms, medical history, and physical exam. Your skin, urine, or blood may be tested to find out what is causing your hives and to rule out other health issues. Your health care provider may also remove a small sample of skin from the affected area and examine it under a microscope (biopsy). How is this treated? Treatment for this condition depends on the cause and severity of your symptoms. Your health care provider may recommend using cool, wet cloths (cool compresses) or taking cool showers to relieve itching. Treatment may include: Medicines that help: Relieve itching (antihistamines). Reduce swelling (corticosteroids). Treat infection (antibiotics). An injectable medicine (omalizumab). Your health care provider may prescribe this if you have chronic idiopathic hives and you continue to have symptoms even after treatment with antihistamines. Severe cases may require an emergency injection of adrenaline (epinephrine) to prevent a life-threatening allergic reaction (anaphylaxis). Follow these instructions at home: Medicines Take and apply over-the-counter and prescription medicines only as told by your health care provider. If you were prescribed an antibiotic medicine, take it as told by your health care provider. Do not stop using the antibiotic even if you start to feel better. Skin care Apply cool compresses to the affected areas. Do not scratch or rub your skin. General instructions Do not take hot showers or baths. This can make itching worse. Do not wear tight-fitting clothing. Use sunscreen and wear protective clothing when you are outside. Avoid any substances that cause your hives. Keep a journal to help  track what causes your hives. Write down: What medicines you take. What you eat  and drink. What products you use on your skin. Keep all follow-up visits as told by your health care provider. This is important. Contact a health care provider if: Your symptoms are not controlled with medicine. Your joints are painful or swollen. Get help right away if: You have a fever. You have pain in your abdomen. Your tongue or lips are swollen. Your eyelids are swollen. Your chest or throat feels tight. You have trouble breathing or swallowing. These symptoms may represent a serious problem that is an emergency. Do not wait to see if the symptoms will go away. Get medical help right away. Call your local emergency services (911 in the [place] Do not drive yourself to the hospital. Summary Hives (urticaria) are itchy, red, swollen areas on your skin. Hives come from the body's reaction to something a person is allergic to (allergen), something that causes irritation, or various other triggers. Treatment for this condition depends on the cause and severity of your symptoms. Avoid any substances that cause your hives. Keep a journal to help track what causes your hives. Take and apply over-the-counter and prescription medicines only as told by your health care provider. Get help right away if your chest or throat feels tight or if you have trouble breathing or swallowing. This information is not intended to replace advice given to you by your health care provider. Make sure you discuss any questions you have with your health care provider. Document Revised: 10/01/2020 Document Reviewed: 10/01/2020 Elsevier Patient Education  2022 [employer]  Rash, Adult A rash is a change in the color of your skin. A rash can also change the way your skin feels. There are many different conditions and factors that can cause a rash. Some rashes may disappear after a few days, but some may last for a few weeks. Common causes of rashes include: Viral infections, such as: Colds. Measles. Hand, foot,  and mouth disease. Bacterial infections, such as: Scarlet fever. Impetigo. Fungal infections, such as Candida. Allergic reactions to food, medicines, or skin care products. Follow these instructions at home: The goal of treatment is to stop the itching and keep the rash from spreading. Pay attention to any changes in your symptoms. Follow these instructions to help with your condition: Medicine Take or apply over-the-counter and prescription medicines only as told by your health care provider. These may include: Corticosteroid creams to treat red or swollen skin. Anti-itch lotions. Oral allergy medicines (antihistamines). Oral corticosteroids for severe symptoms.  Skin care Apply cool compresses to the affected areas. Do not scratch or rub your skin. Avoid covering the rash. Make sure the rash is exposed to air as much as possible. Managing itching and discomfort Avoid hot showers or baths, which can make itching worse. A cold shower may help. Try taking a bath with: Epsom salts. Follow manufacturer instructions on the packaging. You can get these at your local pharmacy or grocery store. Baking soda. Pour a small amount into the bath as told by your health care provider. Colloidal oatmeal. Follow manufacturer instructions on the packaging. You can get this at your local pharmacy or grocery store. Try applying baking soda paste to your skin. Stir water into baking soda until it reaches a paste-like consistency. Try applying calamine lotion. This is an over-the-counter lotion that helps to relieve itchiness. Keep cool and out of the sun. Sweating and being hot can make itching worse. General  instructions  Rest as needed. Drink enough fluid to keep your urine pale yellow. Wear loose-fitting clothing. Avoid scented soaps, detergents, and perfumes. Use gentle soaps, detergents, perfumes, and other cosmetic products. Avoid any substance that causes your rash. Keep a journal to help track  what causes your rash. Write down: What you eat. What cosmetic products you use. What you drink. What you wear. This includes jewelry. Keep all follow-up visits as told by your health care provider. This is important. Contact a health care provider if: You sweat at night. You lose weight. You urinate more than normal. You urinate less than normal, or you notice that your urine is a darker color than usual. You feel weak. You vomit. Your skin or the whites of your eyes look yellow (jaundice). Your skin: Tingles. Is numb. Your rash: Does not go away after several days. Gets worse. You are: Unusually thirsty. More tired than normal. You have: New symptoms. Pain in your abdomen. A fever. Diarrhea. Get help right away if you: Have a fever and your symptoms suddenly get worse. Develop confusion. Have a severe headache or a stiff neck. Have severe joint pains or stiffness. Have a seizure. Develop a rash that covers all or most of your body. The rash may or may not be painful. Develop blisters that: Are on top of the rash. Grow larger or grow together. Are painful. Are inside your nose or mouth. Develop a rash that: Looks like purple pinprick-sized spots all over your body. Has a "bull's eye" or looks like a target. Is not related to sun exposure, is red and painful, and causes your skin to peel. Summary A rash is a change in the color of your skin. Some rashes disappear after a few days, but some may last for a few weeks. The goal of treatment is to stop the itching and keep the rash from spreading. Take or apply over-the-counter and prescription medicines only as told by your health care provider. Contact a health care provider if you have new or worsening symptoms. Keep all follow-up visits as told by your health care provider. This is important. This information is not intended to replace advice given to you by your health care provider. Make sure you discuss any  questions you have with your health care provider. Document Revised: 12/04/2018 Document Reviewed: 03/16/2018 Elsevier Patient Education  2022 [employer].

## 2021-08-23 LAB — URINALYSIS, ROUTINE W REFLEX MICROSCOPIC
Bilirubin Urine: NEGATIVE
Glucose, UA: NEGATIVE
Hgb urine dipstick: NEGATIVE
Ketones, ur: NEGATIVE
Leukocytes,Ua: NEGATIVE
Nitrite: NEGATIVE
Protein, ur: NEGATIVE
Specific Gravity, Urine: 1.006 (ref 1.001–1.035)
pH: 7.5 (ref 5.0–8.0)

## 2021-08-23 NOTE — Addendum Note (Signed)
Addended by: Quentin Ore on: 08/23/2021 04:42 PM   Modules accepted: Orders

## 2021-08-24 ENCOUNTER — Telehealth: Payer: Self-pay | Admitting: Internal Medicine

## 2021-08-24 NOTE — Telephone Encounter (Signed)
Lft pt vm to call ofc to sch US abdomen. thanks 

## 2021-08-28 LAB — CYTOLOGY - PAP
Comment: NEGATIVE
Diagnosis: NEGATIVE
High risk HPV: NEGATIVE

## 2021-08-30 ENCOUNTER — Ambulatory Visit
Admission: RE | Admit: 2021-08-30 | Discharge: 2021-08-30 | Disposition: A | Discharge status: Home / Self Care | Payer: BC Managed Care – PPO | Source: Ambulatory Visit | Attending: Internal Medicine | Admitting: Internal Medicine

## 2021-08-30 ENCOUNTER — Other Ambulatory Visit: Payer: Self-pay

## 2021-08-30 DIAGNOSIS — R748 Abnormal levels of other serum enzymes: Secondary | ICD-10-CM | POA: Diagnosis not present

## 2021-09-13 ENCOUNTER — Other Ambulatory Visit: Payer: Self-pay | Admitting: Internal Medicine

## 2021-09-13 DIAGNOSIS — L259 Unspecified contact dermatitis, unspecified cause: Secondary | ICD-10-CM

## 2021-09-13 DIAGNOSIS — R21 Rash and other nonspecific skin eruption: Secondary | ICD-10-CM

## 2021-09-13 DIAGNOSIS — L509 Urticaria, unspecified: Secondary | ICD-10-CM

## 2023-04-10 ENCOUNTER — Encounter (INDEPENDENT_AMBULATORY_CARE_PROVIDER_SITE_OTHER): Payer: Self-pay

## 2023-04-22 ENCOUNTER — Other Ambulatory Visit: Payer: Self-pay | Admitting: Internal Medicine

## 2023-04-22 DIAGNOSIS — E782 Mixed hyperlipidemia: Secondary | ICD-10-CM

## 2023-04-22 DIAGNOSIS — Z0000 Encounter for general adult medical examination without abnormal findings: Secondary | ICD-10-CM

## 2023-05-02 ENCOUNTER — Ambulatory Visit
Admission: RE | Admit: 2023-05-02 | Discharge: 2023-05-02 | Disposition: A | Discharge status: Home / Self Care | Payer: BC Managed Care – PPO | Source: Ambulatory Visit | Attending: Internal Medicine | Admitting: Internal Medicine

## 2023-05-02 DIAGNOSIS — Z0000 Encounter for general adult medical examination without abnormal findings: Secondary | ICD-10-CM

## 2023-05-02 DIAGNOSIS — E782 Mixed hyperlipidemia: Secondary | ICD-10-CM

## 2023-05-12 ENCOUNTER — Encounter: Payer: BC Managed Care – PPO | Admitting: Family Medicine

## 2023-10-01 ENCOUNTER — Other Ambulatory Visit: Payer: Self-pay | Admitting: Internal Medicine

## 2023-10-01 DIAGNOSIS — Z1231 Encounter for screening mammogram for malignant neoplasm of breast: Secondary | ICD-10-CM

## 2023-10-03 ENCOUNTER — Ambulatory Visit
Admission: RE | Admit: 2023-10-03 | Discharge: 2023-10-03 | Disposition: A | Discharge status: Home / Self Care | Payer: 59 | Source: Ambulatory Visit | Attending: Internal Medicine | Admitting: Internal Medicine

## 2023-10-03 DIAGNOSIS — Z1231 Encounter for screening mammogram for malignant neoplasm of breast: Secondary | ICD-10-CM | POA: Diagnosis present

## 2023-10-08 ENCOUNTER — Other Ambulatory Visit: Payer: Self-pay | Admitting: Internal Medicine

## 2023-10-08 DIAGNOSIS — R928 Other abnormal and inconclusive findings on diagnostic imaging of breast: Secondary | ICD-10-CM

## 2023-10-15 ENCOUNTER — Other Ambulatory Visit: Payer: 59

## 2023-11-21 ENCOUNTER — Ambulatory Visit
Admission: RE | Admit: 2023-11-21 | Discharge: 2023-11-21 | Disposition: A | Discharge status: Home / Self Care | Source: Ambulatory Visit | Attending: Internal Medicine | Admitting: Internal Medicine

## 2023-11-21 DIAGNOSIS — R928 Other abnormal and inconclusive findings on diagnostic imaging of breast: Secondary | ICD-10-CM

## 2023-11-26 ENCOUNTER — Other Ambulatory Visit: Payer: Self-pay | Admitting: Internal Medicine

## 2023-11-26 DIAGNOSIS — R928 Other abnormal and inconclusive findings on diagnostic imaging of breast: Secondary | ICD-10-CM

## 2023-11-28 ENCOUNTER — Ambulatory Visit
Admission: RE | Admit: 2023-11-28 | Discharge: 2023-11-28 | Disposition: A | Discharge status: Home / Self Care | Source: Ambulatory Visit | Attending: Internal Medicine | Admitting: Internal Medicine

## 2023-11-28 DIAGNOSIS — R928 Other abnormal and inconclusive findings on diagnostic imaging of breast: Secondary | ICD-10-CM | POA: Insufficient documentation

## 2023-11-28 DIAGNOSIS — D241 Benign neoplasm of right breast: Secondary | ICD-10-CM | POA: Diagnosis present

## 2023-11-28 HISTORY — PX: BREAST BIOPSY: SHX20

## 2023-11-28 MED ORDER — LIDOCAINE 1 % OPTIME INJ - NO CHARGE
2.0000 mL | Freq: Once | INTRAMUSCULAR | Status: AC
  Administered 2023-11-28: 2 mL
  Filled 2023-11-28: qty 2

## 2023-11-28 MED ORDER — LIDOCAINE-EPINEPHRINE 1 %-1:100000 IJ SOLN
8.0000 mL | Freq: Once | INTRAMUSCULAR | Status: AC
  Administered 2023-11-28: 8 mL
  Filled 2023-11-28: qty 8

## 2023-12-01 LAB — SURGICAL PATHOLOGY
